# Patient Record
Sex: Female | Born: 1985 | Race: White | Hispanic: No | Marital: Married | Smoking: Never smoker
Health system: Southern US, Community
[De-identification: ages and names within clinical notes are randomized; demographics above are authoritative.]

## PROBLEM LIST (undated history)

## (undated) ENCOUNTER — Inpatient Hospital Stay (HOSPITAL_COMMUNITY): Payer: Self-pay

## (undated) DIAGNOSIS — T148XXA Other injury of unspecified body region, initial encounter: Secondary | ICD-10-CM

## (undated) DIAGNOSIS — Z8759 Personal history of other complications of pregnancy, childbirth and the puerperium: Secondary | ICD-10-CM

## (undated) DIAGNOSIS — O139 Gestational [pregnancy-induced] hypertension without significant proteinuria, unspecified trimester: Secondary | ICD-10-CM

## (undated) DIAGNOSIS — F909 Attention-deficit hyperactivity disorder, unspecified type: Secondary | ICD-10-CM

## (undated) DIAGNOSIS — I10 Essential (primary) hypertension: Secondary | ICD-10-CM

## (undated) HISTORY — DX: Personal history of other complications of pregnancy, childbirth and the puerperium: Z87.59

## (undated) HISTORY — DX: Attention-deficit hyperactivity disorder, unspecified type: F90.9

## (undated) HISTORY — DX: Other injury of unspecified body region, initial encounter: T14.8XXA

## (undated) HISTORY — PX: TYMPANOSTOMY TUBE PLACEMENT: SHX32

---
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
## 2005-06-20 ENCOUNTER — Ambulatory Visit: Payer: Self-pay | Admitting: Internal Medicine

## 2005-10-17 ENCOUNTER — Ambulatory Visit: Payer: Self-pay | Admitting: Internal Medicine

## 2006-03-09 ENCOUNTER — Other Ambulatory Visit: Admission: RE | Admit: 2006-03-09 | Discharge: 2006-03-09 | Payer: Self-pay | Admitting: Obstetrics and Gynecology

## 2006-03-16 ENCOUNTER — Ambulatory Visit: Payer: Self-pay | Admitting: Internal Medicine

## 2007-05-19 ENCOUNTER — Telehealth: Payer: Self-pay | Admitting: Internal Medicine

## 2007-05-20 DIAGNOSIS — J309 Allergic rhinitis, unspecified: Secondary | ICD-10-CM | POA: Insufficient documentation

## 2007-05-21 ENCOUNTER — Ambulatory Visit: Payer: Self-pay | Admitting: Internal Medicine

## 2007-05-21 DIAGNOSIS — F909 Attention-deficit hyperactivity disorder, unspecified type: Secondary | ICD-10-CM | POA: Insufficient documentation

## 2007-11-19 ENCOUNTER — Ambulatory Visit: Payer: Self-pay | Admitting: Internal Medicine

## 2007-11-19 DIAGNOSIS — J069 Acute upper respiratory infection, unspecified: Secondary | ICD-10-CM | POA: Insufficient documentation

## 2007-11-29 ENCOUNTER — Telehealth: Payer: Self-pay | Admitting: Internal Medicine

## 2008-05-15 ENCOUNTER — Telehealth: Payer: Self-pay | Admitting: Internal Medicine

## 2008-06-20 ENCOUNTER — Ambulatory Visit: Payer: Self-pay | Admitting: Internal Medicine

## 2008-06-20 DIAGNOSIS — R51 Headache: Secondary | ICD-10-CM | POA: Insufficient documentation

## 2008-06-20 DIAGNOSIS — R519 Headache, unspecified: Secondary | ICD-10-CM | POA: Insufficient documentation

## 2009-07-24 ENCOUNTER — Ambulatory Visit: Payer: Self-pay | Admitting: Internal Medicine

## 2009-09-12 ENCOUNTER — Ambulatory Visit: Payer: Self-pay | Admitting: Family Medicine

## 2009-09-12 DIAGNOSIS — N3000 Acute cystitis without hematuria: Secondary | ICD-10-CM | POA: Insufficient documentation

## 2009-09-12 DIAGNOSIS — N342 Other urethritis: Secondary | ICD-10-CM | POA: Insufficient documentation

## 2009-09-12 DIAGNOSIS — R350 Frequency of micturition: Secondary | ICD-10-CM

## 2009-09-12 LAB — CONVERTED CEMR LAB
Ketones, urine, test strip: NEGATIVE
Nitrite: NEGATIVE
Urobilinogen, UA: 0.2
WBC Urine, dipstick: NEGATIVE

## 2010-06-19 ENCOUNTER — Telehealth: Payer: Self-pay | Admitting: Internal Medicine

## 2010-10-15 NOTE — Progress Notes (Signed)
Summary: fyi---pt cancelled her appt for today  Phone Note Call from Patient   Caller: Patient---live call Summary of Call: pt cancelled her appt. for this afternoon. She stated that her cough wasn't that bad. Initial call taken by: Warnell Forester,  June 19, 2010 2:11 PM

## 2011-01-29 ENCOUNTER — Encounter: Payer: Self-pay | Admitting: Internal Medicine

## 2011-01-29 ENCOUNTER — Ambulatory Visit (INDEPENDENT_AMBULATORY_CARE_PROVIDER_SITE_OTHER): Payer: 59 | Admitting: Internal Medicine

## 2011-01-29 DIAGNOSIS — F909 Attention-deficit hyperactivity disorder, unspecified type: Secondary | ICD-10-CM

## 2011-01-29 DIAGNOSIS — I10 Essential (primary) hypertension: Secondary | ICD-10-CM
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
## 2011-01-29 NOTE — Patient Instructions (Addendum)
Get  A Blood pressure  Machine that you can use  Take readings at least 4 x per week .    Sit for 5 mintues  Get 2-3 readings . Record readings If  160 and above consistently  Then call in the meantime. We may  Add medicatiion Stay off the combined OCPS. Avoid excess sodium Consider Dash diet. Recheck in about 1 month with machine  And readings . Hypertension (High Blood Pressure) As your heart beats, it forces blood through your arteries. This force is your blood pressure. If the pressure is too high, it is called hypertension (HTN) or high blood pressure. HTN is dangerous because you may have it and not know it. High blood pressure may mean that your heart has to work harder to pump blood. Your arteries may be narrow or stiff. The extra work puts you at risk for heart disease, stroke, and other problems.  Blood pressure consists of two numbers, a higher number over a lower, 110/72, for example. It is stated as "110 over 72." The ideal is below 120 for the top number (systolic) and under 80 for the bottom (diastolic). Write down your blood pressure today. You should pay close attention to your blood pressure if you have certain conditions such as:  Heart failure.  Prior heart attack.   Diabetes   Chronic kidney disease.   Prior stroke.   Multiple risk factors for heart disease.   To see if you have HTN, your blood pressure should be measured while you are seated with your arm held at the level of the heart. It should be measured at least twice. A one-time elevated blood pressure reading (especially in the Emergency Department) does not mean that you need treatment. There may be conditions in which the blood pressure is different between your right and left arms. It is important to see your caregiver soon for a recheck. Most people have essential hypertension which means that there is not a specific cause. This type of high blood pressure may be lowered by changing lifestyle factors such  as:  Stress.  Smoking.   Lack of exercise.   Excessive weight.  Drug/tobacco/alcohol use.   Eating less salt.   Most people do not have symptoms from high blood pressure until it has caused damage to the body. Effective treatment can often prevent, delay or reduce that damage. TREATMENT Treatment for high blood pressure, when a cause has been identified, is directed at the cause. There are a large number of medications to treat HTN. These fall into several categories, and your caregiver will help you select the medicines that are best for you. Medications may have side effects. You should review side effects with your caregiver. If your blood pressure stays high after you have made lifestyle changes or started on medicines,   Your medication(s) may need to be changed.   Other problems may need to be addressed.   Be certain you understand your prescriptions, and know how and when to take your medicine.   Be sure to follow up with your caregiver within the time frame advised (usually within two weeks) to have your blood pressure rechecked and to review your medications.   If you are taking more than one medicine to lower your blood pressure, make sure you know how and at what times they should be taken. Taking two medicines at the same time can result in blood pressure that is too low.  SEEK IMMEDIATE MEDICAL CARE IF YOU DEVELOP:  A severe headache, blurred or changing vision, or confusion.   Unusual weakness or numbness, or a faint feeling.   Severe chest or abdominal pain, vomiting, or breathing problems.  MAKE SURE YOU:   Understand these instructions.   Will watch your condition.   Will get help right away if you are not doing well or get worse.  Document Released: 09/01/2005 Document Re-Released: 02/19/2010 [facility] Patient Information 2011 [place], [place].

## 2011-01-30 ENCOUNTER — Encounter: Payer: Self-pay | Admitting: Internal Medicine

## 2011-01-30 DIAGNOSIS — F909 Attention-deficit hyperactivity disorder, unspecified type: Secondary | ICD-10-CM | POA: Insufficient documentation

## 2011-01-30 DIAGNOSIS — I10 Essential (primary) hypertension: Secondary | ICD-10-CM | POA: Insufficient documentation

## 2011-01-30 NOTE — Assessment & Plan Note (Signed)
Get monitor and close follow up call if 160 and above  Other wise lsi  And stay off ocps  .  Disc contraception effects.  Plan more eval at follow up  Or as needed

## 2011-01-31 NOTE — Assessment & Plan Note (Signed)
[facility]                                 ON-CALL NOTE   NAME:Jimenez, Sylvia BOGIE                     MRN:          [id number]  DATE:06/18/2007                            DOB:          06-Nov-1985    6:35 p.m.  Phone number is [phone].  Caller is Cliff from [facility].  Regular  doctor is Dr. Fabian Sharp.  Dr. Milinda Antis on call.   CHIEF COMPLAINT:  No quantity on prescription.  Sylvia Jimenez had come in  for an Adderall prescription.  It is printed out from Dr. Rosezella [place]  office and signed for Adderall 10 mg 1 p.o. b.i.d., but there was no  amount on it.  I told him to go ahead and fill it for 1 month, which  would be 60 with no refills, and to call back if there are any problems.     Marne A. Tower, MD  Electronically Signed    MAT/MedQ  DD: 06/18/2007  DT: 06/19/2007  Job #: [id number]   cc:   Neta Mends. Fabian Sharp, MD

## 2011-03-03 ENCOUNTER — Encounter: Payer: Self-pay | Admitting: Internal Medicine

## 2011-03-03 ENCOUNTER — Ambulatory Visit (INDEPENDENT_AMBULATORY_CARE_PROVIDER_SITE_OTHER): Payer: 59 | Admitting: Internal Medicine

## 2011-03-03 VITALS — BP 140/90 | HR 66 | Wt 173.0 lb

## 2011-03-03 DIAGNOSIS — Z3009 Encounter for other general counseling and advice on contraception: Secondary | ICD-10-CM | POA: Insufficient documentation

## 2011-03-03 DIAGNOSIS — I10 Essential (primary) hypertension: Secondary | ICD-10-CM

## 2011-03-03 NOTE — Patient Instructions (Addendum)
Continue monitoring your blood pressure readings about 1-2 x per week.  Continue lifestyle intervention healthy eating and exercise . Can consider progesterone only contraception. Can consider  Low dose ocp in future with carefully monitoring but not until  We know stability of BP readings over time.   Follow up sooner  Than 6 months  if bp consistently 140/90 or over

## 2011-03-03 NOTE — Progress Notes (Signed)
  Subjective:    Patient ID: Sylvia Jimenez, female    DOB: 1985-11-24, 25 y.o.   MRN: [id number]  HPI Patient comes in for followup of elevated blood pressure readings. Since her last visit she has stopped the combined hormone pills. She has made lifestyle changes. Walking  3- 5 times per week.   Changed eating habit. Her readings at home are mostly controlled but gets an initial high reading the first time. After she sits for a while she gets 120/80 or. She states that she can feel her blood pressure rise under stress. No chest pain shortness of breath. Currently using condoms.  Review of Systems Negative chest pain shortness of breath bleeding bruising.  Past history family history social history reviewed in the electronic medical record.     Objective:   Physical Exam WDWN in nad  Looks well  Patient machine  Reading  Right side sitting   148 / 87  First.   131/86   Second Office cuff 124/84 and 131 /90  And then 124/84 CV s1 s2 o g or m.  No clubbing cyanosis or edema       Reviewed log of  bp readings and mostly 120/80  Or below with ocass 140  .        Assessment & Plan:  Elevated Bp readings of significant intensity   ON OCPS   Aviane... Strong family history of hypertension. Current readings are normal 95% of the time.  At this point in time would stay off estrogen hormones and continue intensification of lifestyle changes. We discussed the possibility of progesterone only contraception but at this point she would like to wait. Because of her young age it is reasonable to try combined hormones and there is no doses in the future but only after blood pressure has been stable for quite a while.    She can discuss this with her new OB/GYN when needed. She will contact us if her blood pressure readings become elevated again.

## 2011-06-05 ENCOUNTER — Other Ambulatory Visit: Payer: Self-pay | Admitting: Obstetrics and Gynecology

## 2011-06-05 ENCOUNTER — Inpatient Hospital Stay (HOSPITAL_COMMUNITY)
Admission: AD | Admit: 2011-06-05 | Discharge: 2011-06-05 | Disposition: A | Discharge status: Home / Self Care | Payer: 59 | Source: Ambulatory Visit | Attending: Obstetrics and Gynecology | Admitting: Obstetrics and Gynecology

## 2011-06-05 ENCOUNTER — Encounter (HOSPITAL_COMMUNITY): Payer: Self-pay | Admitting: *Deleted

## 2011-06-05 DIAGNOSIS — O00109 Unspecified tubal pregnancy without intrauterine pregnancy: Secondary | ICD-10-CM | POA: Insufficient documentation

## 2011-06-05 DIAGNOSIS — O009 Unspecified ectopic pregnancy without intrauterine pregnancy: Secondary | ICD-10-CM

## 2011-06-05 HISTORY — DX: Essential (primary) hypertension: I10

## 2011-06-05 LAB — CBC
HCT: 38.8 % (ref 36.0–46.0)
Hemoglobin: 13.4 g/dL (ref 12.0–15.0)
MCHC: 34.5 g/dL (ref 30.0–36.0)
WBC: 10.2 10*3/uL (ref 4.0–10.5)

## 2011-06-05 LAB — CREATININE, SERUM
Creatinine, Ser: 0.76 mg/dL (ref 0.50–1.10)
GFR calc Af Amer: >60 mL/min (ref >60)
GFR calc non Af Amer: >60 mL/min (ref >60)

## 2011-06-05 LAB — DIFFERENTIAL
Basophils Absolute: 0 10*3/uL (ref 0.0–0.1)
Basophils Relative: 0 % (ref 0–1)
Eosinophils Relative: 1 % (ref 0–5)
Monocytes Absolute: 0.7 10*3/uL (ref 0.1–1.0)
Neutro Abs: 7 10*3/uL (ref 1.7–7.7)

## 2011-06-05 LAB — BUN: BUN: 8 mg/dL (ref 6–23)

## 2011-06-05 LAB — HCG, QUANTITATIVE, PREGNANCY: hCG, Beta Chain, Quant, S: 2697 m[IU]/mL — ABNORMAL HIGH (ref <5)

## 2011-06-05 LAB — TYPE AND SCREEN: Antibody Screen: NEGATIVE

## 2011-06-05 LAB — AST: AST: 24 U/L (ref 0–37)

## 2011-06-05 MED ORDER — METHOTREXATE INJECTION FOR WOMEN'S HOSPITAL
50.0000 mg/m2 | Freq: Once | INTRAMUSCULAR | Status: AC
  Administered 2011-06-05: 95 mg via INTRAMUSCULAR
  Filled 2011-06-05: qty 1.9

## 2011-06-05 NOTE — ED Notes (Signed)
Labs results reviewed with Mayer Camel NP- results called to Dr Senaida Ores.  Both Hope and I spoke with her

## 2011-06-05 NOTE — ED Notes (Signed)
Plan and orders discussed.  Beeper 9 given. Labs ordered.  Time associated with labwork and injection discussed.
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
## 2011-06-05 NOTE — Progress Notes (Signed)
Has been having bad cramps and bleeding, being followed at office, confirmed rt ectopic preg.

## 2011-06-08 ENCOUNTER — Inpatient Hospital Stay (HOSPITAL_COMMUNITY)
Admission: AD | Admit: 2011-06-08 | Discharge: 2011-06-08 | Disposition: A | Discharge status: Home / Self Care | Payer: 59 | Source: Ambulatory Visit | Attending: Obstetrics and Gynecology | Admitting: Obstetrics and Gynecology

## 2011-06-08 DIAGNOSIS — O00109 Unspecified tubal pregnancy without intrauterine pregnancy: Secondary | ICD-10-CM | POA: Insufficient documentation

## 2011-06-08 DIAGNOSIS — O009 Unspecified ectopic pregnancy without intrauterine pregnancy: Secondary | ICD-10-CM

## 2011-06-08 DIAGNOSIS — R1031 Right lower quadrant pain: Secondary | ICD-10-CM | POA: Insufficient documentation
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
## 2011-06-11 ENCOUNTER — Inpatient Hospital Stay (HOSPITAL_COMMUNITY)
Admission: AD | Admit: 2011-06-11 | Discharge: 2011-06-11 | Disposition: A | Discharge status: Home / Self Care | Payer: 59 | Source: Ambulatory Visit | Attending: Obstetrics and Gynecology | Admitting: Obstetrics and Gynecology

## 2011-06-11 ENCOUNTER — Inpatient Hospital Stay (HOSPITAL_COMMUNITY): Payer: 59

## 2011-06-11 ENCOUNTER — Encounter (HOSPITAL_COMMUNITY): Payer: Self-pay

## 2011-06-11 DIAGNOSIS — O00109 Unspecified tubal pregnancy without intrauterine pregnancy: Secondary | ICD-10-CM | POA: Insufficient documentation

## 2011-06-11 DIAGNOSIS — O009 Unspecified ectopic pregnancy without intrauterine pregnancy: Secondary | ICD-10-CM

## 2011-06-11 LAB — AST: AST: 58 U/L — ABNORMAL HIGH (ref 0–37)

## 2011-06-11 LAB — CREATININE, SERUM: Creatinine, Ser: <0.47 mg/dL — ABNORMAL LOW (ref 0.50–1.10)

## 2011-06-11 LAB — DIFFERENTIAL
Basophils Absolute: 0 10*3/uL (ref 0.0–0.1)
Basophils Relative: 0 % (ref 0–1)
Eosinophils Absolute: 0.1 10*3/uL (ref 0.0–0.7)
Neutrophils Relative %: 67 % (ref 43–77)

## 2011-06-11 LAB — CBC
Hemoglobin: 13.5 g/dL (ref 12.0–15.0)
Platelets: 345 10*3/uL (ref 150–400)
RBC: 4.52 MIL/uL (ref 3.87–5.11)
WBC: 8.6 10*3/uL (ref 4.0–10.5)

## 2011-06-11 LAB — HCG, QUANTITATIVE, PREGNANCY: hCG, Beta Chain, Quant, S: 4075 m[IU]/mL — ABNORMAL HIGH (ref <5)

## 2011-06-11 MED ORDER — METHOTREXATE INJECTION FOR WOMEN'S HOSPITAL
50.0000 mg/m2 | Freq: Once | INTRAMUSCULAR | Status: AC
  Administered 2011-06-11: 95 mg via INTRAMUSCULAR
  Filled 2011-06-11: qty 1.9

## 2011-06-11 NOTE — Progress Notes (Signed)
Pt to MAU for repeat BHCG day 7 s/p MTX. Pt states she has a little cramping on the right side and a very light bleeding.
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
## 2011-06-14 ENCOUNTER — Inpatient Hospital Stay (HOSPITAL_COMMUNITY)
Admission: AD | Admit: 2011-06-14 | Discharge: 2011-06-14 | Disposition: A | Discharge status: Home / Self Care | Payer: 59 | Source: Ambulatory Visit | Attending: Obstetrics and Gynecology | Admitting: Obstetrics and Gynecology

## 2011-06-14 DIAGNOSIS — O009 Unspecified ectopic pregnancy without intrauterine pregnancy: Secondary | ICD-10-CM

## 2011-06-14 DIAGNOSIS — O00109 Unspecified tubal pregnancy without intrauterine pregnancy: Secondary | ICD-10-CM | POA: Insufficient documentation

## 2011-06-14 LAB — HCG, QUANTITATIVE, PREGNANCY: hCG, Beta Chain, Quant, S: 3254 m[IU]/mL — ABNORMAL HIGH (ref <5)

## 2011-06-14 NOTE — ED Provider Notes (Signed)
History   Chief Complaint:  Quant follow-up  Sylvia Jimenez is  25 y.o. G1P0 Patient's last menstrual period was 04/21/2011.Marland Kitchen Patient is here for follow up of quantitative HCG and ongoing surveillance of pregnancy status.     Since her last visit, the patient is without new complaint.   The patient reports bleeding as  none now.    General ROS:  negative  Her previous Quantitative HCG values are:MTX # 2 9/26: 4075    Physical Exam   Blood pressure 130/86, pulse 103, temperature 98.3 F (36.8 C), temperature source Oral, resp. rate 16, last menstrual period 04/21/2011.  Focused Gynecological Exam: examination not indicated  Labs: Recent Results (from the past 24 hour(s))  HCG, QUANTITATIVE, PREGNANCY   Collection Time   06/14/11 10:22 AM      Component Value Range   hCG, Beta Chain, Quant, S 3254 (*) <5 (mIU/mL)    MD Consult: D/w Dr. Senaida Ores, will continue to follow quants per protocol   Assessment: Ectopic Pregnancy s/p MTX x2 with Appropriately Falling Quants   Plan: Continue quants per protocol  Lowanda Cashaw E. 06/14/2011, 11:14 AM

## 2011-06-14 NOTE — Progress Notes (Signed)
Bleeding since Wednesday night, abdominal cramping intermittent a little less than first visit, changing a pad every 3 to 4 hours, pain is constant on right side with cramps that come and go.

## 2011-06-17 ENCOUNTER — Inpatient Hospital Stay (HOSPITAL_COMMUNITY)
Admission: AD | Admit: 2011-06-17 | Discharge: 2011-06-17 | Disposition: A | Discharge status: Home / Self Care | Payer: 59 | Source: Ambulatory Visit | Attending: Obstetrics & Gynecology | Admitting: Obstetrics & Gynecology

## 2011-06-17 DIAGNOSIS — O00109 Unspecified tubal pregnancy without intrauterine pregnancy: Secondary | ICD-10-CM | POA: Insufficient documentation

## 2011-06-17 DIAGNOSIS — O009 Unspecified ectopic pregnancy without intrauterine pregnancy: Secondary | ICD-10-CM

## 2011-06-17 LAB — HCG, QUANTITATIVE, PREGNANCY: hCG, Beta Chain, Quant, S: 2391 m[IU]/mL — ABNORMAL HIGH (ref <5)

## 2011-06-17 NOTE — ED Provider Notes (Signed)
Attestation of Attending Supervision of Advanced Practitioner: Evaluation and management procedures were performed by the PA/NP/CNM/OB Fellow under my supervision/collaboration. Chart reviewed and agree with management and plan.  Zriyah Kopplin A 06/17/2011 12:25 PM
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
## 2011-06-17 NOTE — Progress Notes (Signed)
Pt to MAU for day 7 BHCG. Pt state she has some pain at the waist level of the R side. Hurst worse with lying on her left side and with going to the bathroom for BM or urinating. States she continues to have period like bleeding on and off.

## 2011-06-21 ENCOUNTER — Inpatient Hospital Stay (HOSPITAL_COMMUNITY)
Admission: AD | Admit: 2011-06-21 | Discharge: 2011-06-21 | Disposition: A | Discharge status: Home / Self Care | Payer: 59 | Source: Ambulatory Visit | Attending: Obstetrics and Gynecology | Admitting: Obstetrics and Gynecology

## 2011-06-21 DIAGNOSIS — O009 Unspecified ectopic pregnancy without intrauterine pregnancy: Secondary | ICD-10-CM | POA: Diagnosis present

## 2011-06-21 DIAGNOSIS — O00109 Unspecified tubal pregnancy without intrauterine pregnancy: Secondary | ICD-10-CM | POA: Insufficient documentation

## 2011-06-21 NOTE — Progress Notes (Signed)
Patient reports having sharp pains all over abdomen, no bleeding.

## 2011-06-21 NOTE — ED Provider Notes (Signed)
History   Chief Complaint:  Quant follow-up  Sylvia Jimenez is  25 y.o. G1P0 Patient's last menstrual period was 04/21/2011.Marland Kitchen Patient is here for follow up of quantitative HCG and ongoing surveillance of pregnancy status.     Since her last visit, the patient is without new complaint.   The patient reports bleeding as  none now.    General ROS:  negative  Her previous Quantitative HCG values are:MTX # 2 9/26: 4075, 9/29: 3264, 10/2: 2391    Physical Exam   Blood pressure 140/92, pulse 77, temperature 99.2 F (37.3 C), resp. rate 18, last menstrual period 04/21/2011.  Focused Gynecological Exam: examination not indicated  Labs: Recent Results (from the past 24 hour(s))  HCG, QUANTITATIVE, PREGNANCY   Collection Time   06/21/11  8:42 AM      Component Value Range   hCG, Beta Chain, Quant, S 1127 (*) <5 (mIU/mL)      Assessment: Ectopic Pregnancy s/p MTX x2 with Appropriately Falling Quants   Plan: Continue quants weekly per protocol  FRAZIER,NATALIE 06/21/2011, 9:43 AM

## 2011-06-28 ENCOUNTER — Ambulatory Visit (HOSPITAL_COMMUNITY): Payer: 59

## 2011-06-28 ENCOUNTER — Inpatient Hospital Stay (HOSPITAL_COMMUNITY)
Admission: AD | Admit: 2011-06-28 | Discharge: 2011-06-28 | Disposition: A | Discharge status: Home / Self Care | Payer: 59 | Source: Ambulatory Visit | Attending: Obstetrics and Gynecology | Admitting: Obstetrics and Gynecology

## 2011-06-28 DIAGNOSIS — O00109 Unspecified tubal pregnancy without intrauterine pregnancy: Secondary | ICD-10-CM | POA: Insufficient documentation

## 2011-06-28 DIAGNOSIS — O009 Unspecified ectopic pregnancy without intrauterine pregnancy: Secondary | ICD-10-CM

## 2011-06-28 NOTE — Progress Notes (Signed)
Pt here for followup BHCG. Reports pain has been much less reports small amojnt of vaaginal bleeding on and off all week. E. RICE,pa IN ROOM TO EVAL PT. Ok TO LET PT GO HOME ND WILL CALL WITH RESULTS
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
## 2011-06-28 NOTE — ED Notes (Signed)
Pt called by E.Rice,PA for f/u instructions.

## 2011-07-05 ENCOUNTER — Inpatient Hospital Stay (HOSPITAL_COMMUNITY)
Admission: AD | Admit: 2011-07-05 | Discharge: 2011-07-05 | Disposition: A | Discharge status: Home / Self Care | Payer: 59 | Source: Ambulatory Visit | Attending: Obstetrics and Gynecology | Admitting: Obstetrics and Gynecology

## 2011-07-05 DIAGNOSIS — O008 Other ectopic pregnancy without intrauterine pregnancy: Secondary | ICD-10-CM

## 2011-07-05 DIAGNOSIS — O00109 Unspecified tubal pregnancy without intrauterine pregnancy: Secondary | ICD-10-CM | POA: Insufficient documentation

## 2011-07-05 NOTE — Progress Notes (Signed)
Pain is still present sharp all over not as bad as it was, here for BHCG
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
## 2011-08-26 ENCOUNTER — Ambulatory Visit: Payer: 59 | Admitting: Internal Medicine

## 2011-11-27 ENCOUNTER — Telehealth: Payer: Self-pay | Admitting: Internal Medicine

## 2011-11-27 NOTE — Telephone Encounter (Signed)
12/16/11 10:30am for a cpx.

## 2011-11-27 NOTE — Telephone Encounter (Signed)
Pt would like to be set up for a cpx said it had to be done in the next 30days.. Please advise

## 2011-12-09 ENCOUNTER — Other Ambulatory Visit (INDEPENDENT_AMBULATORY_CARE_PROVIDER_SITE_OTHER): Payer: 59

## 2011-12-09 DIAGNOSIS — Z0000 Encounter for general adult medical examination without abnormal findings: Secondary | ICD-10-CM

## 2011-12-09 LAB — POCT URINALYSIS DIPSTICK
Leukocytes, UA: NEGATIVE
Spec Grav, UA: >=1.03

## 2011-12-09 LAB — LIPID PANEL
Cholesterol: 139 mg/dL (ref 0–200)
LDL Cholesterol: 81 mg/dL (ref 0–99)
Triglycerides: 57 mg/dL (ref 0.0–149.0)
VLDL: 11.4 mg/dL (ref 0.0–40.0)

## 2011-12-09 LAB — HEPATIC FUNCTION PANEL
ALT: 31 U/L (ref 0–35)
AST: 29 U/L (ref 0–37)
Albumin: 4.2 g/dL (ref 3.5–5.2)
Total Bilirubin: 0.2 mg/dL — ABNORMAL LOW (ref 0.3–1.2)

## 2011-12-09 LAB — BASIC METABOLIC PANEL
BUN: 9 mg/dL (ref 6–23)
CO2: 24 mEq/L (ref 19–32)
Chloride: 108 mEq/L (ref 96–112)
Glucose, Bld: 93 mg/dL (ref 70–99)
Potassium: 4.9 mEq/L (ref 3.5–5.1)
Sodium: 140 mEq/L (ref 135–145)

## 2011-12-09 LAB — CBC WITH DIFFERENTIAL/PLATELET
Basophils Absolute: 0 10*3/uL (ref 0.0–0.1)
HCT: 41.8 % (ref 36.0–46.0)
Hemoglobin: 13.9 g/dL (ref 12.0–15.0)
Lymphs Abs: 1.5 10*3/uL (ref 0.7–4.0)
MCV: 90 fl (ref 78.0–100.0)
Monocytes Absolute: 0.7 10*3/uL (ref 0.1–1.0)
Neutro Abs: 4.8 10*3/uL (ref 1.4–7.7)
Platelets: 300 10*3/uL (ref 150.0–400.0)
RDW: 12.9 % (ref 11.5–14.6)

## 2011-12-16 ENCOUNTER — Ambulatory Visit (INDEPENDENT_AMBULATORY_CARE_PROVIDER_SITE_OTHER): Payer: 59 | Admitting: Internal Medicine

## 2011-12-16 ENCOUNTER — Encounter: Payer: Self-pay | Admitting: Internal Medicine

## 2011-12-16 VITALS — BP 120/80 | HR 85 | Temp 98.3°F | Ht 65.0 in | Wt 164.0 lb

## 2011-12-16 DIAGNOSIS — F909 Attention-deficit hyperactivity disorder, unspecified type: Secondary | ICD-10-CM

## 2011-12-16 DIAGNOSIS — Z23 Encounter for immunization: Secondary | ICD-10-CM

## 2011-12-16 DIAGNOSIS — Z0000 Encounter for general adult medical examination without abnormal findings: Secondary | ICD-10-CM

## 2011-12-16 NOTE — Patient Instructions (Signed)
Continue pay attention to  lifestyle intervention healthy eating and exercise . tdap today  Come back for PPD tb testing when you can have it read in 48 - 72 hours  And will get form at that time.

## 2011-12-16 NOTE — Progress Notes (Signed)
  Subjective:    Patient ID: Sylvia Jimenez, female    DOB: March 02, 1986, 26 y.o.   MRN: [id number]  HPI Patient comes in today for Preventive Health Care visit  Since last visit she has had a ectopic preg rx with MTX and ok now periods sltightlhy different sees gyne . Using condomes had elevated bp with OCPS.  Changed jobs top [employer] and needs form for work. No reg meds at this time   Review of Systems ROS:   Hx of adhd  Allergy and elevated bp on combined pill s GEN/ HEENT: No fever, significant weight changes sweats headaches vision problems hearing changes, CV/ PULM; No chest pain shortness of breath cough, syncope,edema  change in exercise tolerance. GI /GU: No adominal pain, vomiting, change in bowel habits. No blood in the stool. No significant GU symptoms. SKIN/HEME: ,no acute skin rashes suspicious lesions or bleeding. No lymphadenopathy, nodules, masses.  NEURO/ PSYCH:  No neurologic signs such as weakness numbness. No depression anxiety. IMM/ Allergy: No unusual infections.  Allergy .   REST of 12 system review negative except as per HPI  Past history family history social history reviewed in the electronic medical record.      Objective:   Physical Exam Physical Exam: Vital signs reviewed WUJ:WJXB is a well-developed well-nourished alert cooperative  white female who appears her stated age in no acute distress.  HEENT: normocephalic atraumatic , Eyes: PERRL EOM's full, conjunctiva clear, Nares: paten,t no deformity discharge or tenderness., Ears: no deformity EAC's clear TMs with normal landmarks. Mouth: clear OP, no lesions, edema.  Moist mucous membranes. Dentition in adequate repair. NECK: supple without masses, thyromegaly or bruits. CHEST/PULM:  Clear to auscultation and percussion breath sounds equal no wheeze , rales or rhonchi. No chest wall deformities or tenderness. CV: PMI is nondisplaced, S1 S2 no gallops, murmurs, rubs. Peripheral pulses are full without  delay.No JVD .  Breast: normal by inspection . No dimpling, discharge, masses, tenderness or discharge .  ABDOMEN: Bowel sounds normal nontender  No guard or rebound, no hepato splenomegal no CVA tenderness.  No hernia. Extremtities:  No clubbing cyanosis or edema, no acute joint swelling or redness no focal atrophy NEURO:  Oriented x3, cranial nerves 3-12 appear to be intact, no obvious focal weakness,gait within normal limits no abnormal reflexes or asymmetrical SKIN: No acute rashes normal turgor, color, no bruising or petechiae. PSYCH: Oriented, good eye contact, no obvious depression anxiety, cognition and judgment appear normal. LN: no cervical axillary inguinal adenopathy    Lab Results  Component Value Date   WBC 7.2 12/09/2011   HGB 13.9 12/09/2011   HCT 41.8 12/09/2011   PLT 300.0 12/09/2011   GLUCOSE 93 12/09/2011   CHOL 139 12/09/2011   TRIG 57.0 12/09/2011   HDL 46.90 12/09/2011   LDLCALC 81 12/09/2011   ALT 31 12/09/2011   AST 29 12/09/2011   NA 140 12/09/2011   K 4.9 12/09/2011   CL 108 12/09/2011   CREATININE 0.6 12/09/2011   BUN 9 12/09/2011   CO2 24 12/09/2011   TSH 0.58 12/09/2011       Assessment & Plan:  Preventive Health Care Counseled regarding healthy nutrition, exercise, sleep, injury prevention, calcium vit d and healthy weight . Form completed  tdap today  Come back for ppd and reading for form .

## 2011-12-31 ENCOUNTER — Ambulatory Visit (INDEPENDENT_AMBULATORY_CARE_PROVIDER_SITE_OTHER): Payer: 59

## 2011-12-31 DIAGNOSIS — Z111 Encounter for screening for respiratory tuberculosis: Secondary | ICD-10-CM

## 2012-01-02 LAB — TB SKIN TEST: TB Skin Test: NEGATIVE mm

## 2012-04-02 LAB — CBC PANEL ITEM
Basophils %: 0.6 % (ref 0–1)
Basophils: 0.05 10*3/uL (ref 0.0–0.20)
Eosinophils %: 0.2 10*3/uL (ref 0.0–0.60)
Eosinophils %: 2.5 % (ref 1–5)
Hematocrit: 38.1 % (ref 37–47)
Hemoglobin: 13.6 G/DL (ref 12.0–16.0)
Lymphocytes: 3.57 10*3/uL (ref 1.1–4.5)
Lymphocytes: 43.9 % — ABNORMAL HIGH (ref 20–40)
MCH: 31.2 PG — ABNORMAL HIGH (ref 27–31)
MCHC: 35.7 G/DL (ref 33–37)
MCV: 87.4 FL (ref 81–99)
MPV: 10.8 FL (ref 10.2–13.2)
Monocytes %: 7.5 % (ref 0–10)
Monocytes: 0.61 10*3/uL (ref 0.0–0.9)
Neutrophils %: 45.5 % — ABNORMAL LOW (ref 50–70)
Neutrophils Absolute: 3.71 10*3/uL (ref 1.5–7.5)
Platelets: 183 (ref 130–400)
RBC: 4.36 MIL/uL (ref 4.2–5.4)
RDW: 12.7 % (ref 11.0–14.0)
WBC: 8.14 10*3/uL (ref 4.8–10.8)

## 2012-04-02 LAB — COMPREHENSIVE PANEL
ALT: 14 IU/L (ref 7–35)
AST: 18 IU/L (ref 8–41)
Albumin: 4 G/DL (ref 3.4–4.8)
Alkaline Phosphatase: 62 IU/L (ref 18–125)
Anion Gap: 4 MEQ/L — ABNORMAL LOW (ref 7–19)
BUN: 12 MG/DL (ref 6–20)
CO2: 27 MEQ/L (ref 23–29)
Calcium: 9.2 MG/DL (ref 8.5–10.3)
Chloride: 108 MEQ/L (ref 98–111)
Creatinine: 0.8 MG/DL (ref 0.3–1.3)
Gfr Calculated: 92 mL/min (ref 59–300)
Glucose: 92 MG/DL
Potassium: 4 mEq/L (ref 3.5–5.0)
Sodium: 139 MEQ/L (ref 136–145)
Total Bilirubin: 0.3 MG/DL (ref 0.3–1.2)
Total Protein: 7.3 G/DL (ref 6.4–8.3)

## 2012-04-02 LAB — URINALYSIS
Bilirubin Urine: NEGATIVE
Glucose, Ur: NEGATIVE MG/DL
Ketones, Urine: NEGATIVE MG/DL
Nitrite, Urine: NEGATIVE
Occult Blood,Urine: NEGATIVE
Protein, UA: NEGATIVE MG/DL
Specific Gravity, Urine: 1.025 (ref 1.001–1.035)
Urobilinogen, Urine: 0.2 EU/DL
pH, UA: 7

## 2012-04-02 LAB — LIPASE: Lipase: 24 U/L (ref 12.0–53.0)

## 2012-04-02 LAB — APTT: PTT: 35.8 s — ABNORMAL HIGH (ref 24.5–35.2)

## 2012-04-02 LAB — PROTIME-INR
INR: 1.13 (ref 0.92–1.17)
Protime: 14 s (ref 11.9–14.4)

## 2012-04-02 LAB — AMYLASE: Amylase: 75 U/L (ref 20–104)

## 2012-04-02 LAB — HCGSER PROFILE: HCG(Serum) Pregnancy Test: NEGATIVE

## 2013-02-02 ENCOUNTER — Inpatient Hospital Stay (HOSPITAL_COMMUNITY): Payer: BC Managed Care – PPO

## 2013-02-02 ENCOUNTER — Encounter (HOSPITAL_COMMUNITY): Payer: Self-pay | Admitting: *Deleted

## 2013-02-02 ENCOUNTER — Inpatient Hospital Stay (HOSPITAL_COMMUNITY)
Admission: AD | Admit: 2013-02-02 | Discharge: 2013-02-02 | Disposition: A | Discharge status: Home / Self Care | Payer: BC Managed Care – PPO | Source: Ambulatory Visit | Attending: Obstetrics and Gynecology | Admitting: Obstetrics and Gynecology

## 2013-02-02 DIAGNOSIS — O209 Hemorrhage in early pregnancy, unspecified: Secondary | ICD-10-CM

## 2013-02-02 DIAGNOSIS — O26859 Spotting complicating pregnancy, unspecified trimester: Secondary | ICD-10-CM | POA: Insufficient documentation

## 2013-02-02 DIAGNOSIS — O4690 Antepartum hemorrhage, unspecified, unspecified trimester: Secondary | ICD-10-CM

## 2013-02-02 DIAGNOSIS — R109 Unspecified abdominal pain: Secondary | ICD-10-CM | POA: Insufficient documentation

## 2013-02-02 LAB — CBC
Hemoglobin: 13.5 g/dL (ref 12.0–15.0)
Platelets: 332 10*3/uL (ref 150–400)
RBC: 4.46 MIL/uL (ref 3.87–5.11)
WBC: 13.2 10*3/uL — ABNORMAL HIGH (ref 4.0–10.5)

## 2013-02-02 LAB — HCG, QUANTITATIVE, PREGNANCY: hCG, Beta Chain, Quant, S: 1965 m[IU]/mL — ABNORMAL HIGH (ref <5)

## 2013-02-02 NOTE — MAU Note (Signed)
WAS SEEN IN DR BOVARD'S  OFFICE - 1 WEEK AGO FOR CRAMPING.  SAYS  SPOTTING STARTED THIS AFTERNOON.  LAST SEX- 2 WEEKS AGO.

## 2013-02-02 NOTE — MAU Note (Signed)
Pt presents with complaint of blood on the tissue after wiping tonight. Pt has history of ectopic , per dr bovard prior to pt arrival pt has IUP on u/s in office .
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
## 2013-02-03 ENCOUNTER — Inpatient Hospital Stay (HOSPITAL_COMMUNITY)
Admission: AD | Admit: 2013-02-03 | Discharge: 2013-02-03 | Disposition: A | Discharge status: Home / Self Care | Payer: BC Managed Care – PPO | Source: Ambulatory Visit | Attending: Obstetrics and Gynecology | Admitting: Obstetrics and Gynecology

## 2013-02-03 ENCOUNTER — Encounter (HOSPITAL_COMMUNITY): Payer: Self-pay

## 2013-02-03 DIAGNOSIS — O039 Complete or unspecified spontaneous abortion without complication: Secondary | ICD-10-CM | POA: Insufficient documentation

## 2013-02-03 LAB — URINALYSIS, ROUTINE W REFLEX MICROSCOPIC
Bilirubin Urine: NEGATIVE
Leukocytes, UA: NEGATIVE
Nitrite: NEGATIVE
Specific Gravity, Urine: >1.03 — ABNORMAL HIGH (ref 1.005–1.030)
Urobilinogen, UA: 1 mg/dL (ref 0.0–1.0)
pH: 5.5 (ref 5.0–8.0)

## 2013-02-03 LAB — URINE MICROSCOPIC-ADD ON

## 2013-02-03 LAB — HCG, QUANTITATIVE, PREGNANCY: hCG, Beta Chain, Quant, S: 1284 m[IU]/mL — ABNORMAL HIGH (ref <5)

## 2013-02-03 MED ORDER — HYDROCODONE-ACETAMINOPHEN 10-500 MG PO TABS: 1.0000 | ORAL_TABLET | Freq: Four times a day (QID) | ORAL | Status: DC | PRN

## 2013-02-03 MED ORDER — HYDROCODONE-ACETAMINOPHEN 5-325 MG PO TABS: 1.0000 | ORAL_TABLET | Freq: Four times a day (QID) | ORAL | Status: DC | PRN

## 2013-02-03 NOTE — MAU Note (Signed)
Pt was seen last night and told that if cramping and bleeding got worse to come in. Says she is soaked 1 pad in less than an hour.
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
## 2013-08-04 ENCOUNTER — Encounter: Payer: Self-pay | Admitting: Family Medicine

## 2013-08-04 ENCOUNTER — Ambulatory Visit (INDEPENDENT_AMBULATORY_CARE_PROVIDER_SITE_OTHER): Payer: BC Managed Care – PPO | Admitting: Family Medicine

## 2013-08-04 VITALS — BP 120/80 | Temp 98.7°F | Wt 197.0 lb

## 2013-08-04 DIAGNOSIS — J069 Acute upper respiratory infection, unspecified: Secondary | ICD-10-CM
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
## 2013-08-04 NOTE — Progress Notes (Signed)
Pre visit review using our clinic review tool, if applicable. No additional management support is needed unless otherwise documented below in the visit note. 

## 2013-08-04 NOTE — Patient Instructions (Signed)

## 2013-10-28 ENCOUNTER — Ambulatory Visit (INDEPENDENT_AMBULATORY_CARE_PROVIDER_SITE_OTHER): Payer: BC Managed Care – PPO | Admitting: Internal Medicine

## 2013-10-28 ENCOUNTER — Encounter: Payer: Self-pay | Admitting: Internal Medicine

## 2013-10-28 VITALS — BP 120/94 | HR 99 | Temp 98.5°F | Ht 65.25 in | Wt 205.0 lb

## 2013-10-28 DIAGNOSIS — R590 Localized enlarged lymph nodes: Secondary | ICD-10-CM

## 2013-10-28 DIAGNOSIS — R599 Enlarged lymph nodes, unspecified: Secondary | ICD-10-CM

## 2013-10-28 DIAGNOSIS — J069 Acute upper respiratory infection, unspecified: Secondary | ICD-10-CM

## 2013-10-28 LAB — POCT MONO (EPSTEIN BARR VIRUS): MONO, POC: NEGATIVE

## 2013-10-28 LAB — POCT RAPID STREP A (OFFICE): Rapid Strep A Screen: NEGATIVE

## 2013-10-28 MED ORDER — CEPHALEXIN 500 MG PO CAPS: 500.0000 mg | ORAL_CAPSULE | Freq: Two times a day (BID) | ORAL | Status: DC
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
## 2013-10-28 NOTE — Patient Instructions (Signed)
This is probably viral but the swollen gland is suspicious for fight ing bacterial infection. Strep test will be back next week.  Antibiotic would treat strep or other bacterial infection.  Rapid mono tests in negative    if  persistent or progressive swollen glands etc  Recheck and we may do more blood tests evaluation.

## 2013-10-30 LAB — CULTURE, GROUP A STREP: ORGANISM ID, BACTERIA: NORMAL

## 2014-07-17 ENCOUNTER — Encounter: Payer: Self-pay | Admitting: Internal Medicine

## 2014-09-15 NOTE — L&D Delivery Note (Signed)
Delivery Note At 6:59 PM a viable and healthy female was delivered via Vaginal, Spontaneous Delivery (Presentation: Middle Occiput Anterior).  APGAR: 8, 9; weight P .   Placenta status: Intact, Spontaneous.  Cord: 3 vessels with the following complications: Nuchal x 2.    Anesthesia: Epidural  Episiotomy: none Lacerations: 1st degree perineal Suture Repair: 3.0 vicryl rapide Est. Blood Loss (mL): 50cc  Mom to postpartum.  Baby to Couplet care / Skin to Skin.  Bovard-Stuckert, Endre Coutts 08/18/2015, 7:23 PM  Br/ Tdap in PNC/RI/ O+/Contra?  Desires circumcision for female infant including r/b/a, wish to proceed

## 2014-11-24 ENCOUNTER — Encounter: Payer: Self-pay | Admitting: Family Medicine

## 2014-11-24 ENCOUNTER — Ambulatory Visit (INDEPENDENT_AMBULATORY_CARE_PROVIDER_SITE_OTHER): Payer: BC Managed Care – PPO | Admitting: Family Medicine

## 2014-11-24 VITALS — BP 130/90 | HR 84 | Temp 98.5°F | Wt 215.0 lb

## 2014-11-24 DIAGNOSIS — M545 Low back pain, unspecified: Secondary | ICD-10-CM

## 2014-11-24 MED ORDER — METHOCARBAMOL 500 MG PO TABS: 500.0000 mg | ORAL_TABLET | Freq: Three times a day (TID) | ORAL | Status: DC | PRN
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
## 2014-11-24 NOTE — Patient Instructions (Signed)

## 2014-11-24 NOTE — Progress Notes (Signed)
Pre visit review using our clinic review tool, if applicable. No additional management support is needed unless otherwise documented below in the visit note. 

## 2015-02-07 IMAGING — US US OB TRANSVAGINAL
1 series · 13 of 26 positions shown · non-contrast
Comparison: none

[Series 1: us ob comp less 14 wks · 26 acquisitions, 13 frames shown]
[im 2/26]
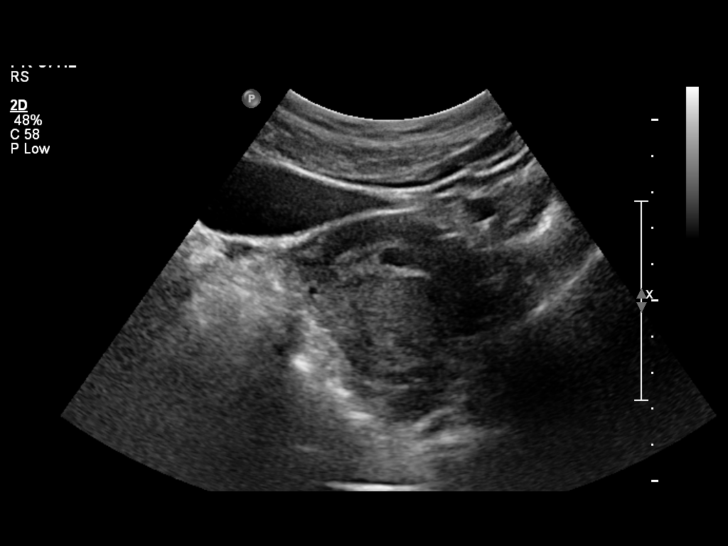
[im 4/26]
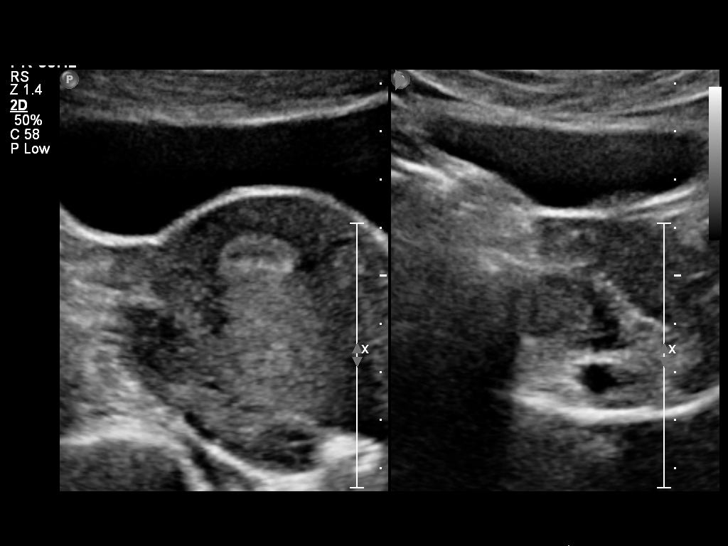
[im 6/26]
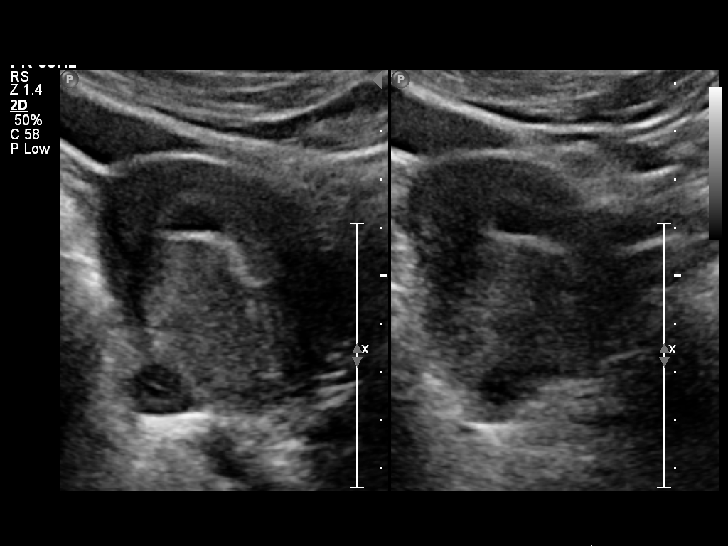
[im 8/26]
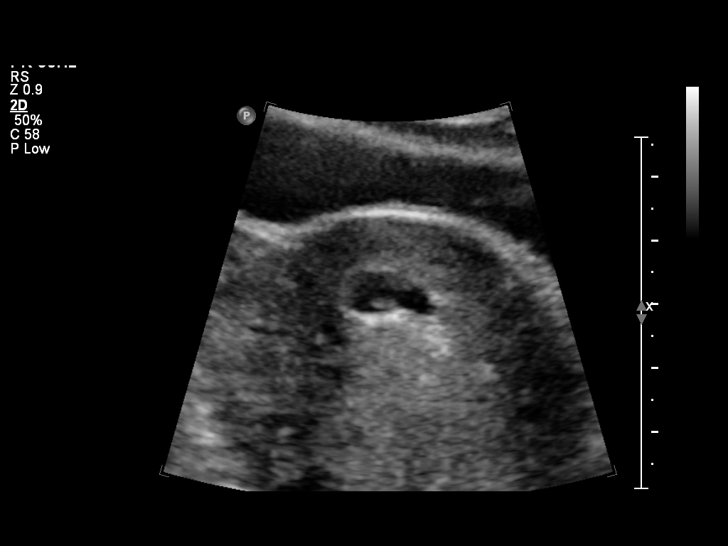
[im 10/26]
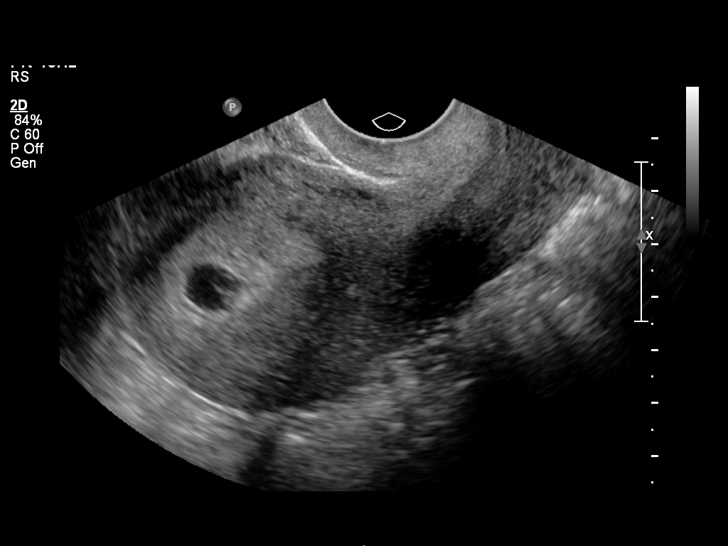
[im 12/26]
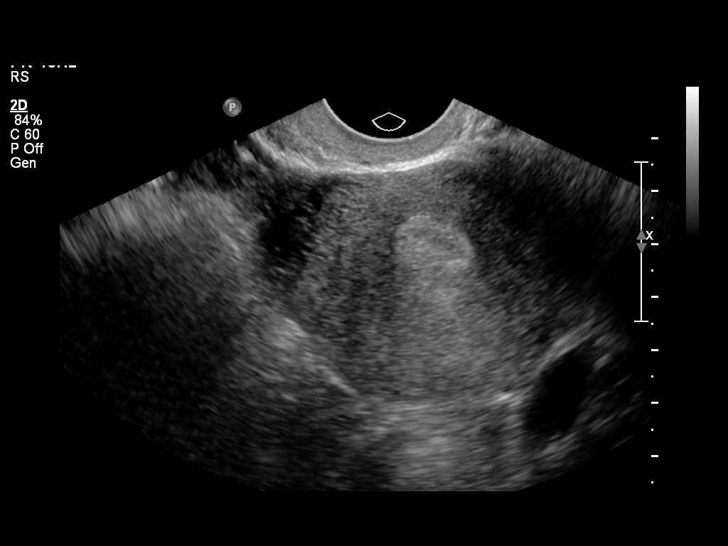
[im 14/26]
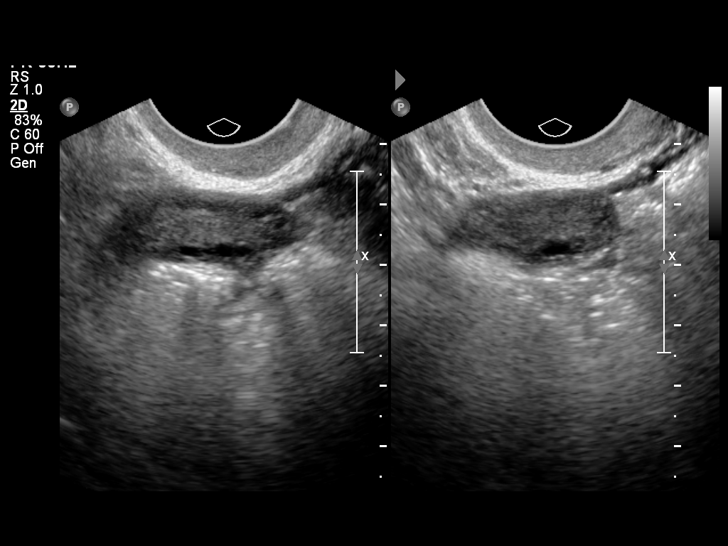
[im 16/26]
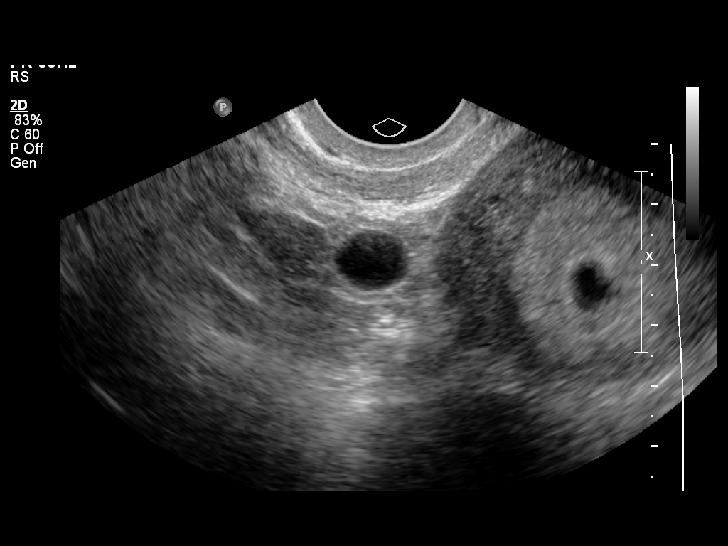
[im 18/26]
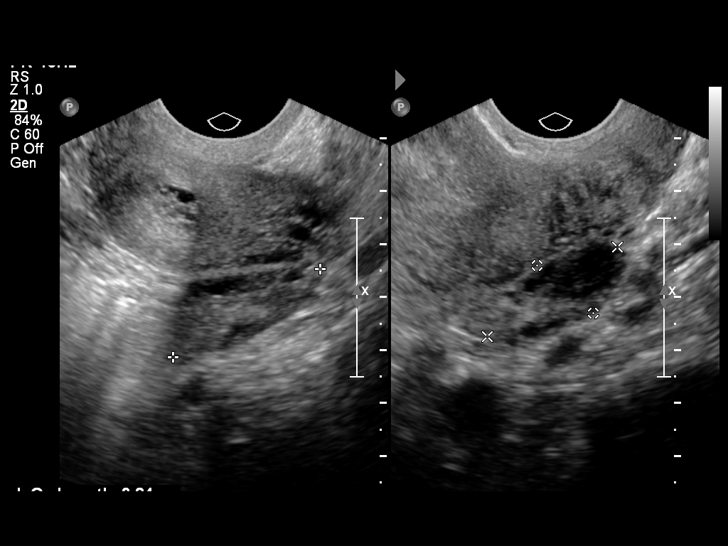
[im 20/26]
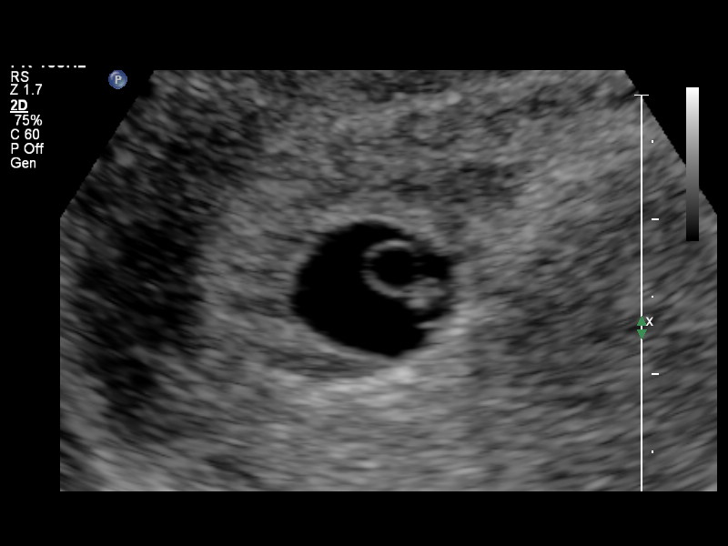
[im 22/26]
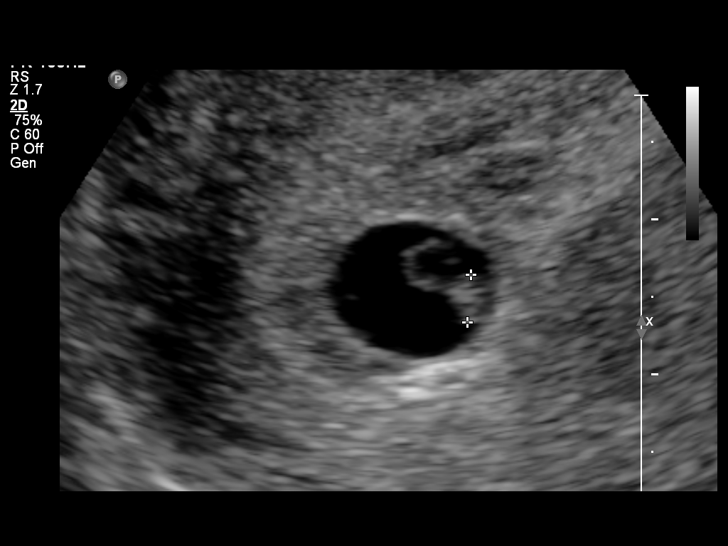
[im 24/26]
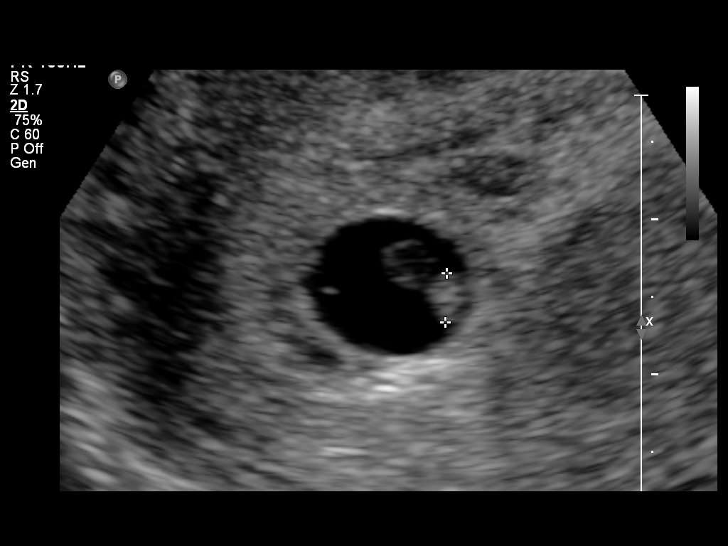
[im 26/26]
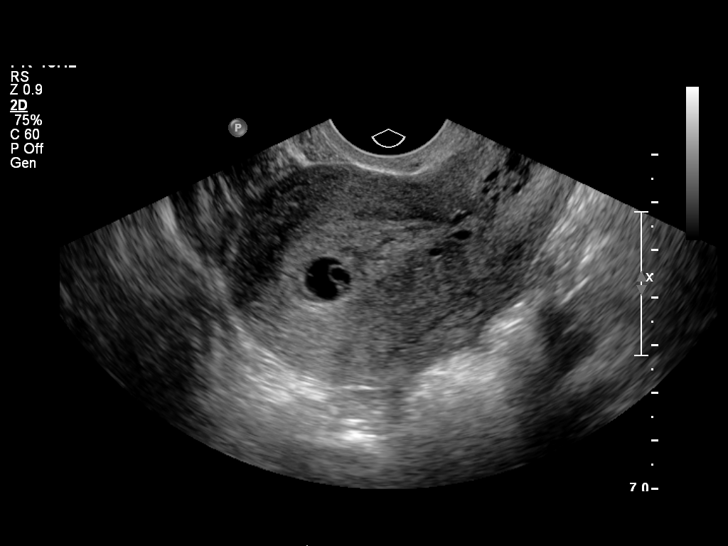

[13 of 26 positions shown; findings below may reference images not displayed]

OBSTETRICS REPORT
                      (Signed Final 02/02/2013 [DATE])

Service(s) Provided

 US OB COMP LESS 14 WKS                                76801.0
 US OB TRANSVAGINAL                                    76817.0
Indications

 Vaginal bleeding, unknown etiology
 Pregnancy with inconclusive fetal viability
 Poor obstetrical history (ectopic pregnancy)
Fetal Evaluation

 Num Of Fetuses:    1
 Preg. Location:    Intrauterine
 Gest. Sac:         Intrauterine
 Yolk Sac:          Visualized
 Fetal Pole:        Visualized
 Fetal Heart Rate:  116                         bpm
 Cardiac Activity:  Observed

 Comment:    No subchorionic hemorrhage seen.
Biometry

 CRL:      3.1  mm    G. Age:   5w 6d                  EDD:   09/29/13
Gestational Age

 LMP:           7w 5d        Date:   12/10/12                 EDD:   09/16/13
 Best:          5w 6d     Det. By:   U/S C R L (02/02/13)     EDD:   09/29/13
Cervix Uterus Adnexa

 Cervix:       Normal appearance by transvaginal scan
 Uterus:       No abnormality visualized.
 Cul De Sac:   No free fluid seen.

 Left Ovary:   Size(cm) L: 3.27 x W: 1.39 x H: 2.21  Volume(cc): 5.3-
               Small corpus luteum noted.
 Right Ovary:  Size(cm) L: 3.22 x W: 2.53 x H: 1.19  Volume(cc):
 Adnexa:     No abnormality visualized.
Impression

 Single living IUP with US Gest. Age of 5w 6d, and EDD of
 09/29/2013.
 No significant maternal uterine or adnexal abnormality
 identified.

 questions or concerns.

## 2015-02-10 ENCOUNTER — Emergency Department: Admit: 2015-02-11 | Payer: MEDICAID | Primary: Pediatrics

## 2015-02-10 ENCOUNTER — Inpatient Hospital Stay: Admit: 2015-02-10 | Discharge: 2015-02-11 | Disposition: A | Discharge status: Home / Self Care | Payer: MEDICAID

## 2015-02-10 DIAGNOSIS — S39012A Strain of muscle, fascia and tendon of lower back, initial encounter: Secondary | ICD-10-CM

## 2015-02-10 NOTE — ED Provider Notes (Signed)
[facility] EMERGENCY DEPT  eMERGENCY dEPARTMENT eNCOUnter      Pt Name: Jane Maxwell  MRN: [id number]  Birthdate 06/30/1986  Date of evaluation: 02/10/2015  Provider: Waunita SchoonerHelen L Jeny Nield, APRN    CHIEF COMPLAINT       Chief Complaint   Patient presents with   ??? Back Pain     patient states that she has low back pain . She states that she has a history of scoliosis. She rates her back pain a 9 out of 10         HISTORY OF PRESENT ILLNESS   (Location/Symptom, Timing/Onset, Context/Setting, Quality, Duration, Modifying Factors, Severity)  Note limiting factors.   Jane FloridaJennifer L Maxwell is a 29 y.o. female who presents to the emergency department with low back pain after slipping on some toys this am.  Pt states she sat down hard.  Has had back pain previously     Patient is a 29 y.o. female presenting with back pain. The history is provided by the patient.   Back Pain  Location:  Lumbar spine  Quality:  Aching  Radiates to:  L thigh and R thigh  Pain severity:  Severe  Onset quality:  Sudden  Duration:  1 day  Timing:  Constant  Progression:  Unchanged  Chronicity:  New  Context: physical stress and recent injury    Associated symptoms: leg pain and tingling    Associated symptoms: no abdominal pain, no bladder incontinence, no bowel incontinence, no headaches and no perianal numbness        Nursing Notes were reviewed.    REVIEW OF SYSTEMS    (2-9 systems for level 4, 10 or more for level 5)     Review of Systems   Gastrointestinal: Negative for abdominal pain and bowel incontinence.   Genitourinary: Negative for bladder incontinence.   Musculoskeletal: Positive for back pain and gait problem.   Neurological: Positive for tingling. Negative for headaches.       Except as noted above the remainder of the review of systems was reviewed and negative.       PAST MEDICAL HISTORY     Past Medical History   Diagnosis Date   ??? Scoliosis    ??? 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 No  pertinent past surgical history.      CURRENT MEDICATIONS       Discharge Medication List as of 02/10/2015 10:22 PM      CONTINUE these medications which have NOT CHANGED    Details   lamoTRIgine (LAMICTAL) 25 MG CHEW chew tab Take 25 mg by mouth daily             ALLERGIES     Review of patient's allergies indicates no known allergies.    FAMILY HISTORY     History reviewed. No pertinent family history.       SOCIAL HISTORY       History     Social History   ??? Marital Status: Legally Separated     Spouse Name: N/A     Number of Children: N/A   ??? Years of Education: N/A     Social History Main Topics   ??? Smoking status: Current Every Day Smoker -- 0.50 packs/day     Types: Cigarettes   ??? Smokeless tobacco: None   ??? Alcohol Use: No   ??? Drug Use: None   ???  Sexual Activity: None     Other Topics Concern   ??? None     Social History Narrative   ??? None       SCREENINGS    Glasgow Coma Scale  Eye Opening: Spontaneous  Best Verbal Response: Oriented  Best Motor Response: Obeys commands  Glasgow Coma Scale Score: 15        PHYSICAL EXAM    (up to 7 for level 4, 8 or more for level 5)   ED Triage Vitals   BP Temp Temp Source Pulse Resp SpO2 Height Weight   02/10/15 1903 02/10/15 1903 02/10/15 1903 02/10/15 1903 02/10/15 1903 02/10/15 1903 02/10/15 1903 02/10/15 1903   118/75 mmHg 98.6 ??F (37 ??C) Tympanic 81 18 100 %  (1.727 m) 215 lb (97.523 kg)       Physical Exam   Constitutional: She is oriented to person, place, and time. She appears well-developed and well-nourished.   HENT:   Head: Normocephalic and atraumatic.   Eyes: Right eye exhibits no discharge. Left eye exhibits no discharge. No scleral icterus.   Neck: Normal range of motion. Neck supple.   Cardiovascular: Normal rate, regular rhythm and normal heart sounds.    Pulmonary/Chest: Effort normal and breath sounds normal. No respiratory distress.   Musculoskeletal:        Lumbar back: She exhibits decreased range of motion, tenderness, bony tenderness and spasm.  She exhibits no swelling, no edema, no deformity, no laceration and no pain.   Neurological: She is alert and oriented to person, place, and time.   Psychiatric: She has a normal mood and affect. Her behavior is normal.   Nursing note and vitals reviewed.      DIAGNOSTIC RESULTS     XR Lumbar Spine Limited   Final Result           Labs Reviewed   PREGNANCY, URINE         EMERGENCY DEPARTMENT COURSE and DIFFERENTIAL DIAGNOSIS/MDM:   Vitals:    Filed Vitals:    02/10/15 1903   BP: 118/75   Pulse: 81   Temp: 98.6 ??F (37 ??C)   TempSrc: Tympanic   Resp: 18   Height:  (1.727 m)   Weight: 215 lb (97.523 kg)   SpO2: 100%           MDM      CONSULTS:  None    PROCEDURES:  Unless otherwise noted below, none     Procedures    FINAL IMPRESSION      1. Lumbar strain, initial encounter          DISPOSITION/PLAN   DISPOSITION     PATIENT REFERRED TO:  [facility]  [address]  [place] [address]  [phone]  Schedule an appointment as soon as possible for a visit  As needed      DISCHARGE MEDICATIONS:  Discharge Medication List as of 02/10/2015 10:22 PM      START taking these medications    Details   cyclobenzaprine (FLEXERIL) 10 MG tablet Take 1 tablet by mouth 3 times daily as needed for Muscle spasms, Disp-30 tablet, R-0      diclofenac (VOLTAREN) 50 MG EC tablet Take 1 tablet by mouth 2 times daily, Disp-30 tablet, R-0                (Please note that portions of this note were completed with a voice recognition program.  Efforts were made to edit  the dictations but occasionally words are mis-transcribed.)    Waunita Schooner, APRN (electronically signed)          Waunita Schooner, APRN  02/11/15 2207

## 2015-02-11 LAB — PREGNANCY, URINE: HCG(Urine) Pregnancy Test: NEGATIVE

## 2015-02-11 MED ORDER — CYCLOBENZAPRINE HCL 10 MG PO TABS
10 MG | ORAL_TABLET | Freq: Three times a day (TID) | ORAL | Status: AC | PRN
Start: 2015-02-11 — End: 2015-02-20

## 2015-02-11 MED ORDER — DICLOFENAC SODIUM 50 MG PO TBEC
50 MG | ORAL_TABLET | Freq: Two times a day (BID) | ORAL | Status: DC
Start: 2015-02-11 — End: 2015-03-26

## 2015-02-11 MED ORDER — HYDROCODONE-ACETAMINOPHEN 7.5-325 MG PO TABS
Freq: Once | ORAL | Status: AC
Start: 2015-02-11 — End: 2015-02-10
  Administered 2015-02-11: 03:00:00 1 via ORAL

## 2015-02-11 MED ORDER — KETOROLAC TROMETHAMINE 30 MG/ML IJ SOLN
30 MG/ML | Freq: Once | INTRAMUSCULAR | Status: AC
Start: 2015-02-11 — End: 2015-02-10
  Administered 2015-02-11: 02:00:00 60 mg via INTRAMUSCULAR

## 2015-02-11 MED FILL — HYDROCODONE-ACETAMINOPHEN 7.5-325 MG PO TABS: 7.5-325 mg | ORAL | Qty: 1

## 2015-02-11 MED FILL — KETOROLAC TROMETHAMINE 30 MG/ML IJ SOLN: 30 mg/mL | INTRAMUSCULAR | Qty: 2

## 2015-02-16 LAB — OB RESULTS CONSOLE ANTIBODY SCREEN: Antibody Screen: NEGATIVE

## 2015-02-16 LAB — OB RESULTS CONSOLE HIV ANTIBODY (ROUTINE TESTING): HIV: NONREACTIVE

## 2015-02-16 LAB — OB RESULTS CONSOLE RPR: RPR: NONREACTIVE

## 2015-02-16 LAB — OB RESULTS CONSOLE RUBELLA ANTIBODY, IGM: RUBELLA: IMMUNE

## 2015-02-16 LAB — OB RESULTS CONSOLE GC/CHLAMYDIA
Chlamydia: NEGATIVE
Gonorrhea: NEGATIVE

## 2015-02-16 LAB — OB RESULTS CONSOLE ABO/RH: RH TYPE: POSITIVE

## 2015-02-16 LAB — OB RESULTS CONSOLE HEPATITIS B SURFACE ANTIGEN: HEP B S AG: NEGATIVE

## 2015-03-26 ENCOUNTER — Inpatient Hospital Stay: Admit: 2015-03-26 | Discharge: 2015-03-26 | Disposition: A | Discharge status: Home / Self Care | Payer: MEDICAID

## 2015-03-26 DIAGNOSIS — S70362A Insect bite (nonvenomous), left thigh, initial encounter: Secondary | ICD-10-CM

## 2015-03-26 MED ORDER — MUPIROCIN 2 % EX OINT
2 % | CUTANEOUS | 0 refills | Status: DC
Start: 2015-03-26 — End: 2016-03-26

## 2015-03-26 NOTE — ED Notes (Signed)
Patient with round discolored area to the left thigh area.  No drainage noted.       Jane RobinsonGina Darnall, RN  03/26/15 [phone]

## 2015-03-26 NOTE — ED Triage Notes (Signed)
Patient reports the area looked like a small bite and developed discoloration today

## 2015-03-26 NOTE — Discharge Instructions (Signed)
Insect Stings and Bites: Care Instructions  Your Care Instructions  Stings and bites from bees, wasps, ants, and other insects often cause pain, swelling, redness, and itching. In some people, especially children, the redness and swelling may be worse. It may extend several inches beyond the affected area. But in most cases, stings and bites don't cause reactions all over the body.  If you have had a reaction to an insect sting or bite, you are at risk for a reaction if you get stung or bitten again.  Follow-up care is a key part of your treatment and safety. Be sure to make and go to all appointments, and call your doctor if you are having problems. It's also a good idea to know your test results and keep a list of the medicines you take.  How can you care for yourself at home?   Do not scratch or rub the skin where the sting or bite occurred.   Put a cold pack or ice cube on the area. Put a thin cloth between the ice and your skin. For some people, a paste of baking soda mixed with a little water helps relieve pain and decrease the reaction.   Take an over-the-counter antihistamine, such as diphenhydramine (Benadryl) or loratadine (Claritin), to relieve swelling, redness, and itching. Calamine lotion or hydrocortisone cream may also help. Do not give antihistamines to your child unless you have checked with the doctor first.   Be safe with medicines. If your doctor prescribed medicine for your allergy, take it exactly as prescribed. Call your doctor if you think you are having a problem with your medicine. You will get more details on the specific medicines your doctor prescribes.   Your doctor may prescribe a shot of epinephrine to carry with you in case you have a severe reaction. Learn how and when to give yourself the shot, and keep it with you at all times. Make sure it has not expired.   Go to the emergency room anytime you have a severe reaction. Go even if you have given yourself epinephrine and  are feeling better. Symptoms can come back.  When should you call for help?  Call 911 anytime you think you may need emergency care. For example, call if:   You have symptoms of a severe allergic reaction. These may include:   Sudden raised, red areas (hives) all over your body.   Swelling of the throat, mouth, lips, or tongue.   Trouble breathing.   Passing out (losing consciousness). Or you may feel very lightheaded or suddenly feel weak, confused, or restless.  Call your doctor now or seek immediate medical care if:   You have symptoms of an allergic reaction not right at the sting or bite, such as:   A rash or hives (raised, red areas on the skin).   Itching.   Swelling.   Belly pain, nausea, or vomiting.   You have a lot of swelling around the site (such as your entire arm or leg is swollen).   You have signs of infection, such as:   Increased pain, swelling, redness, or warmth around the sting.   Red streaks leading from the area.   Pus draining from the sting.   A fever.  Watch closely for changes in your health, and be sure to contact your doctor if:   You do not get better as expected.   Where can you learn more?   Go to [email or web address] and sign in to your   MyChart account. Enter P390 in the Search Health Information box to learn more about "Insect Stings and Bites: Care Instructions."    If you do not have an account, please click on the "Sign Up Now" link.    2006-2016 [employer]. Care instructions adapted under license by [facility]. This care instruction is for use with your licensed healthcare professional. If you have questions about a medical condition or this instruction, always ask your healthcare professional. [employer] disclaims any warranty or liability for your use of this information.  Content Version: 10.8.513193; Current as of: October 27, 2013

## 2015-03-26 NOTE — ED Provider Notes (Signed)
[facility] EMERGENCY DEPT  eMERGENCY dEPARTMENT eNCOUnter      Pt Name: Jane Maxwell  MRN: [id number]  Birthdate 1986/07/01  Date of evaluation: 03/26/2015  Provider: Elwanda Brooklyn, APRN    CHIEF COMPLAINT       Chief Complaint   Patient presents with   ??? Insect Bite     Left thigh area         HISTORY OF PRESENT ILLNESS  (Location/Symptom, Timing/Onset, Context/Setting, Quality, Duration, Modifying Factors, Severity.)   Jane Maxwell is a 29 y.o. female who presents to the emergency department With chief complaint of a tender area located on her left lateral thigh that has been present for the last past three days according to the patient. Patient originally thought she got stung by a mosquito or bit by something and had a localized reaction. She is a resident of the [facility] and showed it to the CNA there and they advised her to present to the ED for further evaluation and treatment. Patient denies any fevers.     The history is provided by the patient.       Nursing Notes were reviewed and I agree.    REVIEW OF SYSTEMS    (2-9 systems for level 4, 10 or more for level 5)     Review of Systems   Constitutional: Negative for chills and fever.   HENT: Negative for congestion, ear pain and sore throat.    Eyes: Negative for discharge.   Respiratory: Negative for cough, shortness of breath and wheezing.    Cardiovascular: Negative for chest pain and palpitations.   Gastrointestinal: Negative for abdominal pain, diarrhea, nausea and vomiting.   Genitourinary: Negative for dysuria, frequency, hematuria and urgency.   Musculoskeletal: Negative for back pain and neck pain.   Skin: Positive for wound (Localized area of bruising noted to the left lateral thigh, could've possibly started out as an insect bite). Negative for rash.   Neurological: Negative for dizziness and headaches.        Except as noted above the remainder of the review of systems was reviewed and negative.       PAST MEDICAL HISTORY     Past  Medical History   Diagnosis Date   ??? 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 Scoliosis          SURGICAL HISTORY     History reviewed. No pertinent past surgical history.      CURRENT MEDICATIONS       Discharge Medication List as of 03/26/2015  2:04 AM      CONTINUE these medications which have NOT CHANGED    Details   busPIRone (BUSPAR) 10 MG tablet Take 10 mg by mouth 3 times daily      CLONIDINE HCL PO Take 0.5 mg by mouth nightly             ALLERGIES     Review of patient's allergies indicates no known allergies.    FAMILY HISTORY     History reviewed. No pertinent family history.       SOCIAL HISTORY       Social History     Social History   ??? Marital status: Legally Separated     Spouse name: N/A   ??? Number of children: N/A   ??? Years of education: N/A     Social History Main Topics   ??? Smoking status: Current Every Day Smoker  Packs/day: 0.50     Types: Cigarettes   ??? Smokeless tobacco: None   ??? Alcohol use No   ??? Drug use: None   ??? Sexual activity: Not Asked     Other Topics Concern   ??? None     Social History Narrative       SCREENINGS           PHYSICAL EXAM    (up to 7 for level 4, 8 or more for level 5)   ED Triage Vitals   BP Temp Temp Source Pulse Resp SpO2 Height Weight   03/26/15 0131 03/26/15 0131 03/26/15 0131 03/26/15 0131 03/26/15 0131 03/26/15 0131 03/26/15 0130 03/26/15 0130   103/64 97.5 ??F (36.4 ??C) Oral 87 16 99 %  (1.753 m) 215 lb (97.5 kg)       Physical Exam   Constitutional: She is oriented to person, place, and time. She appears well-developed and well-nourished. No distress.   HENT:   Head: Normocephalic.   Mouth/Throat: No oropharyngeal exudate.   Eyes: EOM are normal. Pupils are equal, round, and reactive to light. Right eye exhibits no discharge. Left eye exhibits no discharge. No scleral icterus.   Neck: Normal range of motion. No tracheal deviation present.   Cardiovascular: Normal rate, regular rhythm and normal heart sounds.    No murmur  heard.  Pulmonary/Chest: Effort normal and breath sounds normal. No stridor. No respiratory distress. She has no wheezes.   Abdominal: Soft. Bowel sounds are normal. She exhibits no distension. There is no tenderness.   Musculoskeletal: Normal range of motion.   Lymphadenopathy:     She has no cervical adenopathy.   Neurological: She is alert and oriented to person, place, and time.   Skin: Skin is warm and dry. No rash noted. She is not diaphoretic. No erythema. No pallor.        Psychiatric: She has a normal mood and affect. Her behavior is normal. Judgment and thought content normal.   Nursing note and vitals reviewed.        DIAGNOSTIC RESULTS     RADIOLOGY:   Non-plain film images such as CT, Ultrasound and MRI are read by the radiologist. Plain radiographic images are visualized and preliminarily interpreted by No att. providers found with the below findings:        Interpretation per the Radiologist below, if available at the time of this note:    No orders to display       LABS:  Labs Reviewed - No data to display    All other labs were within normal range or not returned as of this dictation.    RE-ASSESSMENT          EMERGENCY DEPARTMENT COURSE and DIFFERENTIAL DIAGNOSIS/MDM:   Vitals:    Vitals:    03/26/15 0130 03/26/15 0131   BP:  103/64   Pulse:  87   Resp:  16   Temp:  97.5 ??F (36.4 ??C)   TempSrc:  Oral   SpO2:  99%   Weight: 215 lb (97.5 kg)    Height:  (1.753 m)            MDM  Number of Diagnoses or Management Options  Insect bite of lower leg with local reaction, left, initial encounter:   Diagnosis management comments: Is in this area has been present for a couple of days now she tells me initially it was raised and red however this area is flat now  this appears to be more bruising with a center clearance. This could've initially started out as an insect bite or a mosquito bite and appears she has had a localized reaction to this however this does not appear infected today this appears to be  in the stages of healing. I feel at this time we should implement any oral antibiotic there is no open lesion there is no abscess or drainage. The area appears to be bruised. We will plan for discharge we'll send her home on Bactroban.      PROCEDURES:    Procedures      FINAL IMPRESSION      1. Insect bite of lower leg with local reaction, left, initial encounter          DISPOSITION/PLAN   DISPOSITION Decision to Discharge    PATIENT REFERRED TO:  Cathi RoanJohn T Cecil, MD  [address]  [place] [id number]  [phone]    Schedule an appointment as soon as possible for a visit  If symptoms worsen      DISCHARGE MEDICATIONS:  Discharge Medication List as of 03/26/2015  2:04 AM      START taking these medications    Details   mupirocin (BACTROBAN) 2 % ointment Apply topically 3 times daily., Disp-15 g, R-0, Print             (Please note that portions of this note were completed with a voice recognition program.  Efforts were made to edit the dictations but occasionally words are mis-transcribed.)    Elwanda Brooklynonya D Makina Skow, APRN       Elwanda Brooklynonya D Chaley Castellanos, APRN  03/26/15 1700       Elwanda Brooklynonya D Shonique Pelphrey, APRN  03/26/15 1700

## 2015-04-23 ENCOUNTER — Telehealth: Payer: Self-pay | Admitting: Internal Medicine

## 2015-04-23 NOTE — Telephone Encounter (Signed)
Opened in error

## 2015-08-07 ENCOUNTER — Encounter (HOSPITAL_COMMUNITY): Payer: Self-pay | Admitting: *Deleted

## 2015-08-07 ENCOUNTER — Inpatient Hospital Stay (HOSPITAL_COMMUNITY)
Admission: AD | Admit: 2015-08-07 | Discharge: 2015-08-07 | Disposition: A | Discharge status: Home / Self Care | Payer: BC Managed Care – PPO | Source: Ambulatory Visit | Attending: Obstetrics and Gynecology | Admitting: Obstetrics and Gynecology

## 2015-08-07 DIAGNOSIS — Z880 Allergy status to penicillin: Secondary | ICD-10-CM | POA: Diagnosis not present

## 2015-08-07 DIAGNOSIS — O133 Gestational [pregnancy-induced] hypertension without significant proteinuria, third trimester: Secondary | ICD-10-CM | POA: Diagnosis not present

## 2015-08-07 DIAGNOSIS — Z3A36 36 weeks gestation of pregnancy: Secondary | ICD-10-CM | POA: Insufficient documentation

## 2015-08-07 DIAGNOSIS — R030 Elevated blood-pressure reading, without diagnosis of hypertension: Secondary | ICD-10-CM | POA: Diagnosis present

## 2015-08-07 LAB — CBC
HEMATOCRIT: 37.3 % (ref 36.0–46.0)
HEMOGLOBIN: 12.6 g/dL (ref 12.0–15.0)
MCH: 30.4 pg (ref 26.0–34.0)
MCHC: 33.8 g/dL (ref 30.0–36.0)
MCV: 89.9 fL (ref 78.0–100.0)
Platelets: 263 10*3/uL (ref 150–400)
RBC: 4.15 MIL/uL (ref 3.87–5.11)
RDW: 13.2 % (ref 11.5–15.5)
WBC: 12 10*3/uL — AB (ref 4.0–10.5)

## 2015-08-07 LAB — COMPREHENSIVE METABOLIC PANEL
ALK PHOS: 121 U/L (ref 38–126)
ALT: 17 U/L (ref 14–54)
AST: 20 U/L (ref 15–41)
Albumin: 3.3 g/dL — ABNORMAL LOW (ref 3.5–5.0)
Anion gap: 9 (ref 5–15)
BILIRUBIN TOTAL: 0.2 mg/dL — AB (ref 0.3–1.2)
BUN: 9 mg/dL (ref 6–20)
CO2: 23 mmol/L (ref 22–32)
Calcium: 9.7 mg/dL (ref 8.9–10.3)
Chloride: 102 mmol/L (ref 101–111)
Creatinine, Ser: 0.55 mg/dL (ref 0.44–1.00)
GFR calc Af Amer: >60 mL/min (ref >60)
GFR calc non Af Amer: >60 mL/min (ref >60)
Glucose, Bld: 87 mg/dL (ref 65–99)
Potassium: 4 mmol/L (ref 3.5–5.1)
Sodium: 134 mmol/L — ABNORMAL LOW (ref 135–145)
TOTAL PROTEIN: 6.6 g/dL (ref 6.5–8.1)

## 2015-08-07 LAB — PROTEIN / CREATININE RATIO, URINE
Creatinine, Urine: 73 mg/dL
PROTEIN CREATININE RATIO: 0.16 mg/mg{creat} — AB (ref 0.00–0.15)
Total Protein, Urine: 12 mg/dL

## 2015-08-07 LAB — URIC ACID: URIC ACID, SERUM: 7 mg/dL — AB (ref 2.3–6.6)

## 2015-08-07 LAB — LACTATE DEHYDROGENASE: LDH: 161 U/L (ref 98–192)

## 2015-08-07 NOTE — Discharge Instructions (Signed)
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
## 2015-08-07 NOTE — MAU Note (Signed)
BP elevated in office today, has been trending up last couple visits.  Slight HA, no visual changes or epigastric pan.  Slight increase in swelling in feet.

## 2015-08-08 ENCOUNTER — Telehealth (HOSPITAL_COMMUNITY): Payer: Self-pay | Admitting: *Deleted

## 2015-08-08 ENCOUNTER — Encounter (HOSPITAL_COMMUNITY): Payer: Self-pay | Admitting: *Deleted

## 2015-08-08 NOTE — Telephone Encounter (Signed)
Preadmission screen  

## 2015-08-10 ENCOUNTER — Inpatient Hospital Stay (HOSPITAL_COMMUNITY)
Admission: AD | Admit: 2015-08-10 | Discharge: 2015-08-10 | Disposition: A | Discharge status: Home / Self Care | Payer: BC Managed Care – PPO | Source: Ambulatory Visit | Attending: Obstetrics and Gynecology | Admitting: Obstetrics and Gynecology

## 2015-08-10 ENCOUNTER — Ambulatory Visit (HOSPITAL_COMMUNITY): Payer: BC Managed Care – PPO

## 2015-08-10 DIAGNOSIS — O163 Unspecified maternal hypertension, third trimester: Secondary | ICD-10-CM

## 2015-08-10 DIAGNOSIS — Z880 Allergy status to penicillin: Secondary | ICD-10-CM | POA: Diagnosis not present

## 2015-08-10 DIAGNOSIS — O10913 Unspecified pre-existing hypertension complicating pregnancy, third trimester: Secondary | ICD-10-CM | POA: Diagnosis not present

## 2015-08-10 DIAGNOSIS — Z3A37 37 weeks gestation of pregnancy: Secondary | ICD-10-CM | POA: Diagnosis not present

## 2015-08-10 DIAGNOSIS — Z3689 Encounter for other specified antenatal screening: Secondary | ICD-10-CM

## 2015-08-10 NOTE — MAU Note (Signed)
Pt here for NST due to office being closed today.  Pt having biweekly NST's due to hypertension.  Pt states baby is moving well and no complaints.  Slight headache x 1 week.  No visual issues.  Denies Epigastric pain.  No vaginal bleeding.

## 2015-08-10 NOTE — Progress Notes (Signed)
NST is reactive, may have had one small variable decel.
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
## 2015-08-16 ENCOUNTER — Encounter (HOSPITAL_COMMUNITY): Payer: Self-pay

## 2015-08-16 DIAGNOSIS — O139 Gestational [pregnancy-induced] hypertension without significant proteinuria, unspecified trimester: Secondary | ICD-10-CM

## 2015-08-16 HISTORY — DX: Gestational (pregnancy-induced) hypertension without significant proteinuria, unspecified trimester: O13.9

## 2015-08-16 LAB — OB RESULTS CONSOLE GBS: GBS: POSITIVE

## 2015-08-16 MED ORDER — MISOPROSTOL 50MCG HALF TABLET: 50.0000 ug | ORAL_TABLET | Freq: Three times a day (TID) | ORAL | Status: DC
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
## 2015-08-18 ENCOUNTER — Inpatient Hospital Stay (HOSPITAL_COMMUNITY): Payer: BC Managed Care – PPO | Admitting: Anesthesiology

## 2015-08-18 ENCOUNTER — Encounter (HOSPITAL_COMMUNITY): Payer: Self-pay | Admitting: *Deleted

## 2015-08-18 ENCOUNTER — Inpatient Hospital Stay (HOSPITAL_COMMUNITY)
Admission: RE | Admit: 2015-08-18 | Discharge: 2015-08-18 | Disposition: A | Discharge status: Home / Self Care | Payer: BC Managed Care – PPO | Source: Ambulatory Visit | Attending: Obstetrics and Gynecology | Admitting: Obstetrics and Gynecology

## 2015-08-18 ENCOUNTER — Inpatient Hospital Stay (HOSPITAL_COMMUNITY)
Admission: AD | Admit: 2015-08-18 | Discharge: 2015-08-20 | DRG: 774 | Disposition: A | Discharge status: Home / Self Care | Payer: BC Managed Care – PPO | Source: Ambulatory Visit | Attending: Obstetrics and Gynecology | Admitting: Obstetrics and Gynecology

## 2015-08-18 DIAGNOSIS — Z3A38 38 weeks gestation of pregnancy: Secondary | ICD-10-CM

## 2015-08-18 DIAGNOSIS — Z803 Family history of malignant neoplasm of breast: Secondary | ICD-10-CM

## 2015-08-18 DIAGNOSIS — Z809 Family history of malignant neoplasm, unspecified: Secondary | ICD-10-CM | POA: Diagnosis not present

## 2015-08-18 DIAGNOSIS — O139 Gestational [pregnancy-induced] hypertension without significant proteinuria, unspecified trimester: Secondary | ICD-10-CM | POA: Diagnosis present

## 2015-08-18 DIAGNOSIS — B019 Varicella without complication: Secondary | ICD-10-CM | POA: Diagnosis present

## 2015-08-18 DIAGNOSIS — O99824 Streptococcus B carrier state complicating childbirth: Secondary | ICD-10-CM | POA: Diagnosis present

## 2015-08-18 DIAGNOSIS — Z8249 Family history of ischemic heart disease and other diseases of the circulatory system: Secondary | ICD-10-CM | POA: Diagnosis not present

## 2015-08-18 DIAGNOSIS — O133 Gestational [pregnancy-induced] hypertension without significant proteinuria, third trimester: Secondary | ICD-10-CM

## 2015-08-18 DIAGNOSIS — O9852 Other viral diseases complicating childbirth: Secondary | ICD-10-CM | POA: Diagnosis present

## 2015-08-18 DIAGNOSIS — O134 Gestational [pregnancy-induced] hypertension without significant proteinuria, complicating childbirth: Principal | ICD-10-CM | POA: Diagnosis present

## 2015-08-18 HISTORY — DX: Gestational (pregnancy-induced) hypertension without significant proteinuria, unspecified trimester: O13.9

## 2015-08-18 LAB — CBC
HCT: 34.9 % — ABNORMAL LOW (ref 36.0–46.0)
HCT: 34.9 % — ABNORMAL LOW (ref 36.0–46.0)
HEMATOCRIT: 33.7 % — AB (ref 36.0–46.0)
HEMOGLOBIN: 12 g/dL (ref 12.0–15.0)
Hemoglobin: 11.8 g/dL — ABNORMAL LOW (ref 12.0–15.0)
Hemoglobin: 11.7 g/dL — ABNORMAL LOW (ref 12.0–15.0)
MCH: 30.2 pg (ref 26.0–34.0)
MCH: 31.1 pg (ref 26.0–34.0)
MCH: 30.8 pg (ref 26.0–34.0)
MCHC: 33.8 g/dL (ref 30.0–36.0)
MCHC: 34.7 g/dL (ref 30.0–36.0)
MCHC: 34.4 g/dL (ref 30.0–36.0)
MCV: 89.3 fL (ref 78.0–100.0)
MCV: 89.6 fL (ref 78.0–100.0)
MCV: 89.7 fL (ref 78.0–100.0)
PLATELETS: 229 10*3/uL (ref 150–400)
PLATELETS: 231 10*3/uL (ref 150–400)
Platelets: 221 10*3/uL (ref 150–400)
RBC: 3.91 MIL/uL (ref 3.87–5.11)
RBC: 3.76 MIL/uL — ABNORMAL LOW (ref 3.87–5.11)
RBC: 3.89 MIL/uL (ref 3.87–5.11)
RDW: 13.2 % (ref 11.5–15.5)
RDW: 13.1 % (ref 11.5–15.5)
RDW: 13.2 % (ref 11.5–15.5)
WBC: 13.4 10*3/uL — ABNORMAL HIGH (ref 4.0–10.5)
WBC: 17.4 10*3/uL — ABNORMAL HIGH (ref 4.0–10.5)
WBC: 22.9 10*3/uL — ABNORMAL HIGH (ref 4.0–10.5)

## 2015-08-18 LAB — TYPE AND SCREEN
ABO/RH(D): O POS
Antibody Screen: NEGATIVE

## 2015-08-18 LAB — RPR: RPR Ser Ql: NONREACTIVE

## 2015-08-18 MED ORDER — LIDOCAINE HCL (PF) 1 % IJ SOLN
30.0000 mL | INTRAMUSCULAR | Status: DC | PRN
  Filled 2015-08-18: qty 30

## 2015-08-18 MED ORDER — DIPHENHYDRAMINE HCL 50 MG/ML IJ SOLN: 12.5000 mg | INTRAMUSCULAR | Status: DC | PRN

## 2015-08-18 MED ORDER — PRENATAL MULTIVITAMIN CH
1.0000 | ORAL_TABLET | Freq: Every day | ORAL | Status: DC
  Administered 2015-08-19: 1 via ORAL
  Filled 2015-08-18: qty 1

## 2015-08-18 MED ORDER — WITCH HAZEL-GLYCERIN EX PADS: 1.0000 "application " | MEDICATED_PAD | CUTANEOUS | Status: DC | PRN

## 2015-08-18 MED ORDER — CLINDAMYCIN PHOSPHATE 900 MG/50ML IV SOLN
900.0000 mg | Freq: Three times a day (TID) | INTRAVENOUS | Status: DC
  Administered 2015-08-18 (×3): 900 mg via INTRAVENOUS
  Filled 2015-08-18 (×6): qty 50

## 2015-08-18 MED ORDER — OXYTOCIN 40 UNITS IN LACTATED RINGERS INFUSION - SIMPLE MED
1.0000 m[IU]/min | INTRAVENOUS | Status: DC
  Administered 2015-08-18: 2 m[IU]/min via INTRAVENOUS
  Filled 2015-08-18: qty 1000

## 2015-08-18 MED ORDER — LACTATED RINGERS IV SOLN
500.0000 mL | INTRAVENOUS | Status: DC | PRN
  Administered 2015-08-18 (×2): 500 mL via INTRAVENOUS
  Administered 2015-08-18: 300 mL via INTRAVENOUS

## 2015-08-18 MED ORDER — SENNOSIDES-DOCUSATE SODIUM 8.6-50 MG PO TABS
2.0000 | ORAL_TABLET | ORAL | Status: DC
  Administered 2015-08-19 (×2): 2 via ORAL
  Filled 2015-08-18 (×2): qty 2

## 2015-08-18 MED ORDER — LACTATED RINGERS IV SOLN: INTRAVENOUS | Status: DC

## 2015-08-18 MED ORDER — OXYCODONE-ACETAMINOPHEN 5-325 MG PO TABS
1.0000 | ORAL_TABLET | ORAL | Status: DC | PRN
  Administered 2015-08-18: 1 via ORAL
  Filled 2015-08-18: qty 1

## 2015-08-18 MED ORDER — ACETAMINOPHEN 325 MG PO TABS: 650.0000 mg | ORAL_TABLET | ORAL | Status: DC | PRN

## 2015-08-18 MED ORDER — TERBUTALINE SULFATE 1 MG/ML IJ SOLN
0.2500 mg | Freq: Once | INTRAMUSCULAR | Status: DC | PRN
  Filled 2015-08-18: qty 1

## 2015-08-18 MED ORDER — LACTATED RINGERS IV SOLN
INTRAVENOUS | Status: DC
  Administered 2015-08-18 (×4): via INTRAVENOUS

## 2015-08-18 MED ORDER — BUTORPHANOL TARTRATE 1 MG/ML IJ SOLN: 1.0000 mg | INTRAMUSCULAR | Status: DC | PRN

## 2015-08-18 MED ORDER — OXYTOCIN 40 UNITS IN LACTATED RINGERS INFUSION - SIMPLE MED
62.5000 mL/h | INTRAVENOUS | Status: DC
  Administered 2015-08-18: 62.5 mL/h via INTRAVENOUS

## 2015-08-18 MED ORDER — FENTANYL 2.5 MCG/ML BUPIVACAINE 1/10 % EPIDURAL INFUSION (WH - ANES)
14.0000 mL/h | INTRAMUSCULAR | Status: DC | PRN
  Administered 2015-08-18 (×2): 14 mL/h via EPIDURAL
  Filled 2015-08-18 (×2): qty 125

## 2015-08-18 MED ORDER — OXYCODONE-ACETAMINOPHEN 5-325 MG PO TABS: 2.0000 | ORAL_TABLET | ORAL | Status: DC | PRN

## 2015-08-18 MED ORDER — DIPHENHYDRAMINE HCL 25 MG PO CAPS: 25.0000 mg | ORAL_CAPSULE | Freq: Four times a day (QID) | ORAL | Status: DC | PRN

## 2015-08-18 MED ORDER — BENZOCAINE-MENTHOL 20-0.5 % EX AERO
1.0000 "application " | INHALATION_SPRAY | CUTANEOUS | Status: DC | PRN
  Administered 2015-08-19 (×2): 1 via TOPICAL
  Filled 2015-08-18 (×2): qty 56

## 2015-08-18 MED ORDER — DIBUCAINE 1 % RE OINT: 1.0000 "application " | TOPICAL_OINTMENT | RECTAL | Status: DC | PRN

## 2015-08-18 MED ORDER — ONDANSETRON HCL 4 MG/2ML IJ SOLN: 4.0000 mg | INTRAMUSCULAR | Status: DC | PRN

## 2015-08-18 MED ORDER — CITRIC ACID-SODIUM CITRATE 334-500 MG/5ML PO SOLN: 30.0000 mL | ORAL | Status: DC | PRN

## 2015-08-18 MED ORDER — MISOPROSTOL 25 MCG QUARTER TABLET
25.0000 ug | ORAL_TABLET | Freq: Three times a day (TID) | ORAL | Status: DC
  Administered 2015-08-18: 25 ug via VAGINAL
  Filled 2015-08-18: qty 0.25

## 2015-08-18 MED ORDER — LANOLIN HYDROUS EX OINT: TOPICAL_OINTMENT | CUTANEOUS | Status: DC | PRN

## 2015-08-18 MED ORDER — LIDOCAINE HCL (PF) 1 % IJ SOLN
INTRAMUSCULAR | Status: DC | PRN
  Administered 2015-08-18 (×2): 5 mL

## 2015-08-18 MED ORDER — OXYTOCIN BOLUS FROM INFUSION: 500.0000 mL | INTRAVENOUS | Status: DC

## 2015-08-18 MED ORDER — PHENYLEPHRINE 40 MCG/ML (10ML) SYRINGE FOR IV PUSH (FOR BLOOD PRESSURE SUPPORT)
80.0000 ug | PREFILLED_SYRINGE | INTRAVENOUS | Status: DC | PRN
  Filled 2015-08-18: qty 20
  Filled 2015-08-18: qty 2

## 2015-08-18 MED ORDER — IBUPROFEN 600 MG PO TABS
600.0000 mg | ORAL_TABLET | Freq: Four times a day (QID) | ORAL | Status: DC
  Administered 2015-08-19 – 2015-08-20 (×6): 600 mg via ORAL
  Filled 2015-08-18 (×5): qty 1

## 2015-08-18 MED ORDER — ONDANSETRON HCL 4 MG PO TABS: 4.0000 mg | ORAL_TABLET | ORAL | Status: DC | PRN

## 2015-08-18 MED ORDER — ZOLPIDEM TARTRATE 5 MG PO TABS: 5.0000 mg | ORAL_TABLET | Freq: Every evening | ORAL | Status: DC | PRN

## 2015-08-18 MED ORDER — OXYCODONE-ACETAMINOPHEN 5-325 MG PO TABS: 1.0000 | ORAL_TABLET | ORAL | Status: DC | PRN

## 2015-08-18 MED ORDER — ONDANSETRON HCL 4 MG/2ML IJ SOLN: 4.0000 mg | Freq: Four times a day (QID) | INTRAMUSCULAR | Status: DC | PRN

## 2015-08-18 MED ORDER — EPHEDRINE 5 MG/ML INJ
10.0000 mg | INTRAVENOUS | Status: DC | PRN
  Filled 2015-08-18: qty 2

## 2015-08-18 MED ORDER — SIMETHICONE 80 MG PO CHEW: 80.0000 mg | CHEWABLE_TABLET | ORAL | Status: DC | PRN

## 2015-08-18 NOTE — Anesthesia Preprocedure Evaluation (Signed)
Anesthesia Evaluation  Patient identified by MRN, date of birth, ID band Patient awake    Reviewed: Allergy & Precautions, H&P , NPO status , Patient's Chart, lab work & pertinent test results  History of Anesthesia Complications Negative for: history of anesthetic complications  Airway Mallampati: II  TM Distance: >3 FB Neck ROM: full    Dental no notable dental hx. (+) Teeth Intact   Pulmonary neg pulmonary ROS,    Pulmonary exam normal breath sounds clear to auscultation       Cardiovascular hypertension, negative cardio ROS Normal cardiovascular exam Rhythm:regular Rate:Normal     Neuro/Psych negative neurological ROS  negative psych ROS   GI/Hepatic negative GI ROS, Neg liver ROS,   Endo/Other  negative endocrine ROS  Renal/GU negative Renal ROS  negative genitourinary   Musculoskeletal   Abdominal   Peds  Hematology negative hematology ROS (+)   Anesthesia Other Findings   Reproductive/Obstetrics (+) Pregnancy                             Anesthesia Physical Anesthesia Plan  ASA: II  Anesthesia Plan: Epidural   Post-op Pain Management:    Induction:   Airway Management Planned:   Additional Equipment:   Intra-op Plan:   Post-operative Plan:   Informed Consent: I have reviewed the patients History and Physical, chart, labs and discussed the procedure including the risks, benefits and alternatives for the proposed anesthesia with the patient or authorized representative who has indicated his/her understanding and acceptance.     Plan Discussed with:   Anesthesia Plan Comments:         Anesthesia Quick Evaluation  

## 2015-08-18 NOTE — Progress Notes (Signed)
Patient ID: Sylvia Jimenez, female   DOB: 01-01-1986, 29 y.o.   MRN: [id number]  Comfortable with epidural   AF BP elevated 133-158/78-106  gen NAD FHTs 130's, good variability, category 1 toco Q 1-3 min  SVE 2.3/50/-2  AROM for copious fluid  Continue current mgmt Augment with pitocin prn

## 2015-08-18 NOTE — Progress Notes (Signed)
Patient ID: Sylvia SharkJennifer K Geurin, female   DOB: April 19, 1986, 29 y.o.   MRN: [id number]  Pt with increased pelvic pressure  AFVSS gen NAD FHTs 130-140, good var, variable decels with contractions, category 2 toco Q 2-853min  SVE 6.7/90/0-+1  Continue current mgmt

## 2015-08-18 NOTE — Progress Notes (Signed)
Patient ID: Sylvia Jimenez, female   DOB: 02-Sep-1986, 29 y.o.   MRN: [id number]  Feeling contractions.  Getting uncomfortable  AF, BP 130-158/78-106  gen NAD FHTs 130-140's, good variability, category 1 toco q 1-423min  SVE 1.2/50/-2  Pt desires epidural before ROM

## 2015-08-18 NOTE — Progress Notes (Signed)
Patient safely ambulated to and from bathroom without difficulty.

## 2015-08-18 NOTE — Anesthesia Procedure Notes (Signed)
Epidural Patient location during procedure: OB  Staffing Anesthesiologist: Phillips GroutARIGNAN, Zenith Lamphier Performed by: anesthesiologist   Preanesthetic Checklist Completed: patient identified, site marked, surgical consent, pre-op evaluation, timeout performed, IV checked, risks and benefits discussed and monitors and equipment checked  Epidural Patient position: sitting Prep: DuraPrep Patient monitoring: heart rate, continuous pulse ox and blood pressure Approach: left paramedian Location: L3-L4 Injection technique: LOR saline  Needle:  Needle type: Tuohy  Needle gauge: 17 G Needle length: 9 cm and 9 Needle insertion depth: 5 cm Catheter type: closed end flexible Catheter size: 20 Guage Catheter at skin depth: 9 cm Test dose: negative  Assessment Events: blood not aspirated, injection not painful, no injection resistance, negative IV test and no paresthesia  Additional Notes Patient identified. Risks/Benefits/Options discussed with patient including but not limited to bleeding, infection, nerve damage, paralysis, failed block, incomplete pain control, headache, blood pressure changes, nausea, vomiting, reactions to medication both or allergic, itching and postpartum back pain. Confirmed with bedside nurse the patient's most recent platelet count. Confirmed with patient that they are not currently taking any anticoagulation, have any bleeding history or any family history of bleeding disorders. Patient expressed understanding and wished to proceed. All questions were answered. Sterile technique was used throughout the entire procedure. Please see nursing notes for vital signs. Test dose was given through epidural needle and negative prior to continuing to dose epidural or start infusion. Warning signs of high block given to the patient including shortness of breath, tingling/numbness in hands, complete motor block, or any concerning symptoms with instructions to call for help. Patient was given  instructions on fall risk and not to get out of bed. All questions and concerns addressed with instructions to call with any issues.

## 2015-08-19 LAB — CBC
HCT: 30.9 % — ABNORMAL LOW (ref 36.0–46.0)
Hemoglobin: 10.6 g/dL — ABNORMAL LOW (ref 12.0–15.0)
MCH: 30.8 pg (ref 26.0–34.0)
MCHC: 34.3 g/dL (ref 30.0–36.0)
MCV: 89.8 fL (ref 78.0–100.0)
PLATELETS: 199 10*3/uL (ref 150–400)
RBC: 3.44 MIL/uL — ABNORMAL LOW (ref 3.87–5.11)
RDW: 13.4 % (ref 11.5–15.5)
WBC: 19.9 10*3/uL — ABNORMAL HIGH (ref 4.0–10.5)

## 2015-08-19 MED ORDER — FAMOTIDINE 20 MG PO TABS
20.0000 mg | ORAL_TABLET | Freq: Every day | ORAL | Status: DC
  Filled 2015-08-19: qty 1

## 2015-08-19 MED ORDER — CALCIUM CARBONATE ANTACID 500 MG PO CHEW
1.0000 | CHEWABLE_TABLET | ORAL | Status: DC | PRN
  Administered 2015-08-19: 200 mg via ORAL
  Filled 2015-08-19: qty 1

## 2015-08-19 MED ORDER — LABETALOL HCL 100 MG PO TABS
100.0000 mg | ORAL_TABLET | Freq: Two times a day (BID) | ORAL | Status: DC
  Administered 2015-08-19 – 2015-08-20 (×3): 100 mg via ORAL
  Filled 2015-08-19 (×3): qty 1

## 2015-08-19 NOTE — Progress Notes (Addendum)
Post Partum Day 1 Subjective: no complaints, voiding, tolerating PO and nl lochia, pain controlled  Objective: Blood pressure 139/89, pulse 90, temperature 97.5 F (36.4 C), temperature source Oral, resp. rate 20, height 5\' 5"  (1.651 m), weight 104.781 kg (231 lb), SpO2 98 %, unknown if currently breastfeeding.  Physical Exam:  General: alert and no distress Lochia: appropriate Uterine Fundus: firm   Recent Labs  08/18/15 2100 08/19/15 0527  HGB 12.0 10.6*  HCT 34.9* 30.9*    Assessment/Plan: Plan for discharge tomorrow, Breastfeeding and Lactation consult.  Routine care.  BP more elevated will start Labetalol 100mg  bid   LOS: 1 day   Bovard-Stuckert, Dionisia Pacholski 08/19/2015, 8:07 AM

## 2015-08-19 NOTE — Lactation Note (Signed)
This note was copied from the chart of Sylvia Jimenez Diloreto. Lactation Consultation Note  Patient Name: Sylvia Jimenez Plaskett ZOXWR'UToday's Date: 08/19/2015 Reason for consult: Initial assessment Visited with Mom, baby 4516 hrs old.  Mom latched baby easily with a deep areolar grasp, causing no discomfort.  Baby feeding and swallowing well.  Mom discussed her diagnosis of PCOS prior to this pregnancy.  Encouraged skin to skin, and cue based feedings.  Brochure given to Mom.  Informed Mom of IP and OP lactation services available to her.  To call for assistance prn, and follow up in am.    Consult Status Consult Status: Follow-up Date: 08/20/15 Follow-up type: In-patient    Judee ClaraSmith, Ulyses Panico E 08/19/2015, 11:56 AM

## 2015-08-19 NOTE — Anesthesia Postprocedure Evaluation (Signed)
Anesthesia Post Note  Patient: Sylvia Jimenez  Procedure(s) Performed: * No procedures listed *  Patient location during evaluation: Mother Baby Anesthesia Type: Epidural Level of consciousness: awake and alert and oriented Pain management: satisfactory to patient Vital Signs Assessment: post-procedure vital signs reviewed and stable Respiratory status: respiratory function stable and spontaneous breathing Cardiovascular status: stable Postop Assessment: No headache, No backache, Patient able to bend at knees, No signs of nausea or vomiting, no headache, no backache, patient able to bend at knees and Adequate PO intake (No residual numbness) Anesthetic complications: no    Last Vitals:  Filed Vitals:   08/18/15 2225 08/19/15 0225  BP: 122/76 139/89  Pulse: 110 90  Temp: 36.8 C 36.4 C  Resp: 20 20    Last Pain:  Filed Vitals:   08/19/15 0556  PainSc: 4                  Zeddie Njie

## 2015-08-19 NOTE — Progress Notes (Signed)
Acknowledged order for social work consult regarding mother's hx ADHD  Referral is screened out by Clinical Social Worker because none of the following criteria appear to apply: -History of anxiety/depression during this pregnancy, or of post-partum depression. - Diagnosis of anxiety and/or depression within last 3 years - History of depression due to pregnancy loss/loss of child or -MOB's symptoms are currently being treated with medication and/or therapy.  Please contact the Clinical Social Worker if needs arise or upon MOB request.   CSW completed chart review and spoke with RN caring for mother.  RN reported that the MOB is bonding/interacting well with the infant. RN reported that MOB did not appear anxious/overwhelmed at this time.  No acute social concerns were noted. 

## 2015-08-19 NOTE — Progress Notes (Signed)
Pt had BP 148/99, recheck 144/91.  Pt declines headache or visual changes.  Informed Dr Ellyn HackBovard of pt BP.  Ordered Labetalol 100mg  twice daily and to call with diastolic BP > 100.

## 2015-08-20 MED ORDER — IBUPROFEN 800 MG PO TABS: 800.0000 mg | ORAL_TABLET | Freq: Three times a day (TID) | ORAL | Status: AC | PRN

## 2015-08-20 MED ORDER — OXYCODONE-ACETAMINOPHEN 5-325 MG PO TABS: 1.0000 | ORAL_TABLET | Freq: Four times a day (QID) | ORAL | Status: AC | PRN

## 2015-08-20 MED ORDER — PRENATAL MULTIVITAMIN CH: 1.0000 | ORAL_TABLET | Freq: Every day | ORAL | Status: AC

## 2015-08-20 MED ORDER — LABETALOL HCL 100 MG PO TABS: 100.0000 mg | ORAL_TABLET | Freq: Two times a day (BID) | ORAL | Status: AC

## 2015-08-20 NOTE — Progress Notes (Signed)
Post Partum Day 2 Subjective: no complaints, up ad lib, voiding, tolerating PO and nl lochia, pain controlled  Objective: Blood pressure 144/94, pulse 78, temperature 98.3 F (36.8 C), temperature source Oral, resp. rate 18, height 5\' 5"  (1.651 m), weight 104.781 kg (231 lb), SpO2 100 %, unknown if currently breastfeeding.  Physical Exam:  General: alert and no distress Lochia: appropriate Uterine Fundus: firm   Recent Labs  08/18/15 2100 08/19/15 0527  HGB 12.0 10.6*  HCT 34.9* 30.9*    Assessment/Plan: Discharge home, Breastfeeding and Lactation consult.  BP controlled with labetalol.  D/c home with motrin, percocet and PNV.  F/u 2 wks BP check and 6 weeks full postpartum check   LOS: 2 days   Bovard-Stuckert, Vergil Burby 08/20/2015, 7:23 AM

## 2015-08-20 NOTE — Lactation Note (Signed)
This note was copied from the chart of Sylvia Roney MansJennifer Stumph. Lactation Consultation Note Experienced BF mom is BF this little one great. States has discomfort to Rt. Nipples. Noted bruising to Rt. Everted nipples. States breast are filling, discussed engorgement and prevention. Reminded of OP LC services if needed and support group. Encouraged to keep up with I&O and discussed supply and demand. Patient Name: Sylvia Roney MansJennifer Bahri ZOXWR'UToday's Date: 08/20/2015 Reason for consult: Follow-up assessment   Maternal Data    Feeding Feeding Type: Breast Fed Length of feed: 25 min  LATCH Score/Interventions Latch: Grasps breast easily, tongue down, lips flanged, rhythmical sucking. Intervention(s): Adjust position;Assist with latch  Audible Swallowing: Spontaneous and intermittent Intervention(s): Skin to skin;Hand expression  Type of Nipple: Everted at rest and after stimulation  Comfort (Breast/Nipple): Filling, red/small blisters or bruises, mild/mod discomfort  Problem noted: Mild/Moderate discomfort Interventions (Mild/moderate discomfort): Comfort gels  Hold (Positioning): Assistance needed to correctly position infant at breast and maintain latch. Intervention(s): Breastfeeding basics reviewed;Support Pillows;Skin to skin;Position options  LATCH Score: 8  Lactation Tools Discussed/Used     Consult Status Consult Status: Complete Date: 08/20/15    Charyl DancerCARVER, Nigel Wessman G 08/20/2015, 8:00 AM

## 2015-08-20 NOTE — Discharge Summary (Signed)
OB Discharge Summary     Patient Name: Sylvia Jimenez DOB: January 06, 1986 MRN: [id number]  Date of admission: 08/18/2015 Delivering MD: Sherian Rein   Date of discharge: 08/20/2015  Admitting diagnosis: 38wks, induction Intrauterine pregnancy: [redacted]w[redacted]d     Secondary diagnosis:  Active Problems:   PIH (pregnancy induced hypertension)    Discharge diagnosis: Term Pregnancy Delivered and Gestational Hypertension                                                                                                Post partum procedures:N/A  Augmentation: AROM and Pitocin  Complications: None  Hospital course:  Induction of Labor With Vaginal Delivery   29 y.o. yo [id number] at [redacted]w[redacted]d was admitted to the hospital 08/18/2015 for induction of labor.  Indication for induction: Gestational hypertension.  Patient had an uncomplicated labor course as follows: Membrane Rupture Time/Date: 11:38 AM ,08/18/2015   Intrapartum Procedures: Episiotomy:                                           Lacerations:  1st degree [2]  Patient had delivery of a Viable infant.  Information for the patient's newborn:  Karel, Mowers [[id number]]  Delivery Method: Vag-Spont   08/18/2015  Details of delivery can be found in separate delivery note.  Patient had a routine postpartum course. Patient is discharged home No discharge date for patient encounter.    Physical exam  Filed Vitals:   08/19/15 1103 08/19/15 1730 08/19/15 2213 08/20/15 0639  BP: 143/96 145/82 144/94   Pulse: 111 87 79 78  Temp:  97.8 F (36.6 C)  98.3 F (36.8 C)  TempSrc:  Oral    Resp:  16  18  Height:      Weight:      SpO2:  99%  100%   General: alert and no distress Lochia: appropriate Uterine Fundus: firm  Labs: Lab Results  Component Value Date   WBC 19.9* 08/19/2015   HGB 10.6* 08/19/2015   HCT 30.9* 08/19/2015   MCV 89.8 08/19/2015   PLT 199 08/19/2015   CMP Latest Ref Rng 08/07/2015  Glucose 65 - 99 mg/dL 87   BUN 6 - 20 mg/dL 9  Creatinine 1.30 - 8.65 mg/dL 7.84  Sodium 696 - 295 mmol/L 134(L)  Potassium 3.5 - 5.1 mmol/L 4.0  Chloride 101 - 111 mmol/L 102  CO2 22 - 32 mmol/L 23  Calcium 8.9 - 10.3 mg/dL 9.7  Total Protein 6.5 - 8.1 g/dL 6.6  Total Bilirubin 0.3 - 1.2 mg/dL 2.8(U)  Alkaline Phos 38 - 126 U/L 121  AST 15 - 41 U/L 20  ALT 14 - 54 U/L 17    Discharge instruction: per After Visit Summary and "Baby and Me Booklet".  After visit meds:    Medication List    TAKE these medications        acetaminophen 500 MG tablet  Commonly known as:  TYLENOL  Take 1,000 mg by mouth every  6 (six) hours as needed for headache.     ALLEGRA PO  Take 1 tablet by mouth daily.     BENADRYL PO  Take 1 tablet by mouth at bedtime as needed (itching).     DICLEGIS 10-10 MG Tbec  Generic drug:  Doxylamine-Pyridoxine  Take 1 tablet by mouth at bedtime as needed (nausea).     hydrocortisone cream 1 %  Apply 1 application topically daily as needed for itching.     ibuprofen 800 MG tablet  Commonly known as:  ADVIL,MOTRIN  Take 1 tablet (800 mg total) by mouth every 8 (eight) hours as needed.     labetalol 100 MG tablet  Commonly known as:  NORMODYNE  Take 1 tablet (100 mg total) by mouth 2 (two) times daily.     oxyCODONE-acetaminophen 5-325 MG tablet  Commonly known as:  PERCOCET/ROXICET  Take 1-2 tablets by mouth every 6 (six) hours as needed for severe pain.     prenatal multivitamin Tabs tablet  Take 1 tablet by mouth daily at 12 noon.     ZANTAC 150 MG tablet  Generic drug:  ranitidine  Take 150 mg by mouth at bedtime.        Diet: routine diet  Activity: Advance as tolerated. Pelvic rest for 6 weeks.   Outpatient follow up:2 weeks BP check and 6 weeks for full postpartum check.   Follow up Appt:No future appointments. Follow up Visit:No Follow-up on file.  Postpartum contraception: Undecided  Newborn Data: Live born female  Birth Weight: 7 lb (3175 g) APGAR: 8,  9  Baby Feeding: Breast Disposition:home with mother   08/20/2015 Sherian ReinBovard-Stuckert, Ayah Cozzolino, MD

## 2016-03-26 ENCOUNTER — Emergency Department: Admit: 2016-03-27 | Payer: MEDICAID | Primary: Pediatrics

## 2016-03-26 DIAGNOSIS — S93402A Sprain of unspecified ligament of left ankle, initial encounter: Secondary | ICD-10-CM

## 2016-03-26 NOTE — ED Triage Notes (Signed)
Patient was helping move furniture, fell off the edge of a small step. Patient states she felt a pop in her ankle a while after the fall. After she could not put pressure on her foot at all.

## 2016-03-26 NOTE — ED Provider Notes (Signed)
[facility] EMERGENCY DEPT  eMERGENCY dEPARTMENT eNCOUnter      Pt Name: Jane Maxwell  MRN: [id number]  Birthdate 06-21-1986  Date of evaluation: 03/26/2016  Provider: Darrin Luis, APRN    CHIEF COMPLAINT       Chief Complaint   Patient presents with   ??? Ankle Pain     Left ankle.Fell off [employer], felt pop while walking after.         HISTORY OF PRESENT ILLNESS   (Location/Symptom, Timing/Onset, Context/Setting, Quality, Duration, Modifying Factors, Severity)  Note limiting factors.   Jane Maxwell is a 30 y.o. female who presents to the emergency department for complaints of left ankle pain. Patient states that she was walking approximately 2 hours ago and turned her ankle on the sidewalk. She states that is progressively got more painful. She has not taken anything for the symptoms. She states the pain is worse when walking/standing. She denies any prior injury.     HPI    Nursing Notes were reviewed.    REVIEW OF SYSTEMS    (2-9 systems for level 4, 10 or more for level 5)     Review of Systems   Musculoskeletal:        Left ankle/foot pain   All other systems reviewed and are negative.      A complete review of systems was performed and is negative except as noted above in the HPI.       PAST MEDICAL HISTORY     Past Medical History:   Diagnosis Date   ??? 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 Scoliosis          SURGICAL HISTORY     History reviewed. No pertinent surgical history.      CURRENT MEDICATIONS       Previous Medications    BUSPIRONE (BUSPAR) 10 MG TABLET    Take 10 mg by mouth 3 times daily    LAMOTRIGINE (LAMICTAL) 25 MG TABLET    Take 100 mg by mouth nightly    QUETIAPINE (SEROQUEL) 25 MG TABLET    Take 25 mg by mouth nightly       ALLERGIES     Pcn [penicillins] and Tylenol [acetaminophen]    FAMILY HISTORY     History reviewed. No pertinent family history.       SOCIAL HISTORY       Social History     Social History   ??? Marital status: Legally Separated     Spouse name: N/A    ??? Number of children: N/A   ??? Years of education: N/A     Social History Main Topics   ??? Smoking status: Current Every Day Smoker     Packs/day: 0.50     Types: Cigarettes   ??? Smokeless tobacco: None   ??? Alcohol use No      Comment: Occasional   ??? Drug use: No   ??? Sexual activity: Not Asked     Other Topics Concern   ??? None     Social History Narrative       SCREENINGS             PHYSICAL EXAM    (up to 7 for level 4, 8 or more for level 5)   ED Triage Vitals   BP Temp Temp Source Pulse Resp SpO2 Height Weight   03/26/16 2246 03/26/16 2246 03/26/16 2246 03/26/16 2246 03/26/16 2246 03/26/16 2246 03/26/16 2246 03/26/16  2246   124/72 98.5 ??F (36.9 ??C) Oral 103 18 98 % 5\' 9"  (1.753 m) 290 lb (131.5 kg)       Physical Exam   Constitutional: She is oriented to person, place, and time. She appears well-developed and well-nourished.   HENT:   Head: Normocephalic and atraumatic.   Eyes: No scleral icterus.   Neck: Normal range of motion. Neck supple.   Cardiovascular: Normal rate, regular rhythm, normal heart sounds and intact distal pulses.    No murmur heard.  Pulmonary/Chest: Effort normal and breath sounds normal.   Musculoskeletal: She exhibits no edema.        Left ankle: She exhibits decreased range of motion (secondary to pain). She exhibits no swelling, no ecchymosis and no deformity. Tenderness. Lateral malleolus and medial malleolus tenderness found. Achilles tendon normal.   Neurological: She is alert and oriented to person, place, and time.   Skin: Skin is warm, dry and intact. No lesion and no rash noted.   Psychiatric: She has a normal mood and affect. Her speech is normal and behavior is normal. Judgment and thought content normal.   Vitals reviewed.      DIAGNOSTIC RESULTS     EKG: All EKG's are interpreted by the Emergency Department Physician who either signs or Co-signs this chart in the absence of a cardiologist.        RADIOLOGY:   Non-plain film images such as CT, Ultrasound and MRI are read by the  radiologist. Plain radiographic images are visualized and preliminarily interpreted by the emergency physician with the below findings:    Ankle and foot x-rays negative per Dr. Dareen PianoAnderson    Interpretation per the Radiologist below, if available at the time of this note:    XR Ankle Left Standard    (Results Pending)   XR Foot Left Standard    (Results Pending)         ED BEDSIDE ULTRASOUND:   Performed by ED Physician - none    LABS:  Labs Reviewed - No data to display    All other labs were within normal range or not returned as of this dictation.    REASSESSMENT         EMERGENCY DEPARTMENT COURSE and DIFFERENTIAL DIAGNOSIS/MDM:   Vitals:    Vitals:    03/26/16 2246   BP: 124/72   Pulse: 103   Resp: 18   Temp: 98.5 ??F (36.9 ??C)   TempSrc: Oral   SpO2: 98%   Weight: 290 lb (131.5 kg)   Height: 5\' 9"  (1.753 m)       MDM      CONSULTS:  None    PROCEDURES:  Unless otherwise noted below, none     Procedures    FINAL IMPRESSION      1. Sprain of left ankle, unspecified ligament, initial encounter          DISPOSITION/PLAN   DISPOSITION Decision To Discharge    PATIENT REFERRED TO:  Cathi RoanJohn T Cecil, MD  [address]  [place] [id number]  [phone]      As needed      DISCHARGE MEDICATIONS:  New Prescriptions    NAPROXEN (NAPROSYN) 500 MG TABLET    Take 1 tablet by mouth 2 times daily          (Please note that portions of this note were completed with a voice recognition program.  Efforts were made to edit the dictations but occasionally words  are mis-transcribed.)    Darrin Luis, APRN (electronically signed)  Attending Emergency Physician           Darrin Luis, APRN  03/27/16 [phone]

## 2016-03-27 ENCOUNTER — Inpatient Hospital Stay: Admit: 2016-03-27 | Discharge: 2016-03-27 | Disposition: A | Discharge status: Home / Self Care | Payer: MEDICAID

## 2016-03-27 MED ORDER — NAPROXEN 500 MG PO TABS
500 MG | ORAL_TABLET | Freq: Two times a day (BID) | ORAL | 0 refills | Status: AC
Start: 2016-03-27 — End: ?

## 2016-03-27 NOTE — Discharge Instructions (Signed)
Ankle Sprain: Care Instructions  Your Care Instructions    An ankle sprain can happen when you twist your ankle. The ligaments that support the ankle can get stretched and torn. Often the ankle is swollen and painful.  Ankle sprains may take from several weeks to several months to heal. Usually, the more pain and swelling you have, the more severe your ankle sprain is and the longer it will take to heal. You can heal faster and regain strength in your ankle with good home treatment.  It is very important to give your ankle time to heal completely, so that you do not easily hurt your ankle again.  Follow-up care is a key part of your treatment and safety. Be sure to make and go to all appointments, and call your doctor if you are having problems. It's also a good idea to know your test results and keep a list of the medicines you take.  How can you care for yourself at home?   Prop up your foot on pillows as much as possible for the next 3 days. Try to keep your ankle above the level of your heart. This will help reduce the swelling.   Follow your doctor's directions for wearing a splint or elastic bandage. Wrapping the ankle may help reduce or prevent swelling.   Your doctor may give you a splint, a brace, an air stirrup, or another form of ankle support to protect your ankle until it is healed. Wear it as directed while your ankle is healing. Do not remove it unless your doctor tells you to. After your ankle has healed, ask your doctor whether you should wear the brace when you exercise.   Put ice or cold packs on your injured ankle for 10 to 20 minutes at a time. Try to do this every 1 to 2 hours for the next 3 days (when you are awake) or until the swelling goes down. Put a thin cloth between the ice and your skin.   You may need to use crutches until you can walk without pain. If you do use crutches, try to bear some weight on your injured ankle if you can do so without pain. This helps the ankle  heal.   Take pain medicines exactly as directed.   If the doctor gave you a prescription medicine for pain, take it as prescribed.   If you are not taking a prescription pain medicine, ask your doctor if you can take an over-the-counter medicine.   If you have been given ankle exercises to do at home, do them exactly as instructed. These can promote healing and help prevent lasting weakness.  When should you call for help?  Call your doctor now or seek immediate medical care if:   Your pain is getting worse.   Your swelling is getting worse.   Your splint feels too tight or you are unable to loosen it.  Watch closely for changes in your health, and be sure to contact your doctor if:   You are not getting better after 1 week.  Where can you learn more?  Go to [email or web address] and sign in to your MyChart account. Enter [phone] in the Search Health Information box to learn more about "Ankle Sprain: Care Instructions."     If you do not have an account, please click on the "Sign Up Now" link.  Current as of: Feb 05, 2015  Content Version: 11.2   2006-2017 [employer]. Care instructions adapted under  license by [employer]. If you have questions about a medical condition or this instruction, always ask your healthcare professional. [employer] disclaims any warranty or liability for your use of this information.

## 2016-06-13 ENCOUNTER — Ambulatory Visit (INDEPENDENT_AMBULATORY_CARE_PROVIDER_SITE_OTHER): Payer: BC Managed Care – PPO | Admitting: Family Medicine

## 2016-06-13 DIAGNOSIS — Z23 Encounter for immunization: Secondary | ICD-10-CM | POA: Diagnosis not present

## 2016-07-11 ENCOUNTER — Ambulatory Visit (INDEPENDENT_AMBULATORY_CARE_PROVIDER_SITE_OTHER): Payer: BC Managed Care – PPO | Admitting: Family Medicine

## 2016-07-11 DIAGNOSIS — Z23 Encounter for immunization: Secondary | ICD-10-CM

## 2017-04-17 ENCOUNTER — Ambulatory Visit (INDEPENDENT_AMBULATORY_CARE_PROVIDER_SITE_OTHER): Payer: BC Managed Care – PPO

## 2017-04-17 DIAGNOSIS — Z111 Encounter for screening for respiratory tuberculosis: Secondary | ICD-10-CM | POA: Diagnosis not present

## 2017-04-17 NOTE — Progress Notes (Signed)
PPD Placement note Owens SharkJennifer K Hitson, 31 y.o. female is here today for placement of PPD test Reason for PPD test: employment Pt taken PPD test before: yes Verified in allergy area and with patient that they are not allergic to the products PPD is made of (Phenol or Tween). Yes Is patient taking any oral or IV steroid medication now or have they taken it in the last month? no Has the patient ever received the BCG vaccine?: no Has the patient been in recent contact with anyone known or suspected of having active TB disease?: no      Date of exposure (if applicable): n/a      Name of person they were exposed to (if applicable): n/a Patient's [place] O: Alert and oriented in NAD. P:  PPD placed on 04/17/2017.  Patient advised to return for reading within 48-72 hours.

## 2017-04-20 ENCOUNTER — Telehealth: Payer: Self-pay

## 2017-04-20 LAB — TB SKIN TEST
INDURATION: 0 mm
TB Skin Test: NEGATIVE

## 2017-04-20 NOTE — Telephone Encounter (Signed)
Patient had her PPD read today at 8.20am, the results were Negative and a school Employee Certificate of Evaluation for Tuberculosis form was filled and signed by Dr Fabian SharpPanosh, original document was given back to patient and a copy was put for scanning in pt chart. Read by Mendel CorningNancy Pauletta Pickney CMA

## 2017-06-05 ENCOUNTER — Encounter: Payer: Self-pay | Admitting: Internal Medicine

## 2017-12-02 ENCOUNTER — Telehealth: Payer: Self-pay | Admitting: Internal Medicine

## 2017-12-02 NOTE — Telephone Encounter (Signed)
Pt aware that Dr Fabian SharpPanosh does not normally prescribe prophylactic Tamiflu d/t risks associated with the medication being higher than the benefit .  States that she is out of town with her family and both her son and husband were diagnosed with the flu. [place] SpanielSaid that they will be back this weekend. Pt advised that if she starts having any flu-like symptoms to got to [facility] to be treated somewhere local to where she is vacationing. Pt also advised that if she becomes sick over the weekend that we can see her next week if needed. Pt will call if anything changes. Nothing further needed.   Will send to Dr Fabian SharpPanosh as Lorain ChildesFYI

## 2017-12-02 NOTE — Telephone Encounter (Signed)
Copied from CRM [phone]. Topic: Quick Communication - See Telephone Encounter >> Dec 02, 2017  3:42 PM Terisa Starraylor, Brittany L wrote: CRM for notification. See Telephone encounter for:   12/02/17.  Pt said that her husband and son both were diagnosed with Flu. Patient is requesting tami-flu. Please advise  Patient is out of town for the week  [facility] [address]

## 2017-12-02 NOTE — Telephone Encounter (Signed)
don't  Have enough information   BUT   I dont advise  prophylaxis for   Adult  Patients  Average risk  More risk than benefit    if getting sx   Fever  Flu can be  Evaluated .  On office
